# Patient Record
Sex: Female | Born: 1990 | Race: Black or African American | Hispanic: No | Marital: Single | Smoking: Current every day smoker
Health system: Southern US, Community
[De-identification: ages and names within clinical notes are randomized; demographics above are authoritative.]

## PROBLEM LIST (undated history)

## (undated) DIAGNOSIS — Z789 Other specified health status: Secondary | ICD-10-CM

## (undated) HISTORY — PX: NO PAST SURGERIES: SHX2092

---

## 2017-08-19 ENCOUNTER — Emergency Department (HOSPITAL_COMMUNITY)
Admission: EM | Admit: 2017-08-19 | Discharge: 2017-08-19 | Disposition: A | Discharge status: Home / Self Care | Payer: Self-pay | Attending: Emergency Medicine | Admitting: Emergency Medicine

## 2017-08-19 ENCOUNTER — Other Ambulatory Visit: Payer: Self-pay

## 2017-08-19 ENCOUNTER — Encounter (HOSPITAL_COMMUNITY): Payer: Self-pay | Admitting: *Deleted

## 2017-08-19 DIAGNOSIS — Z202 Contact with and (suspected) exposure to infections with a predominantly sexual mode of transmission: Secondary | ICD-10-CM | POA: Insufficient documentation

## 2017-08-19 DIAGNOSIS — N898 Other specified noninflammatory disorders of vagina: Secondary | ICD-10-CM | POA: Insufficient documentation

## 2017-08-19 LAB — WET PREP, GENITAL
Clue Cells Wet Prep HPF POC: NONE SEEN
Sperm: NONE SEEN
Trich, Wet Prep: NONE SEEN
Yeast Wet Prep HPF POC: NONE SEEN

## 2017-08-19 NOTE — Discharge Instructions (Signed)
Please read attached information. If you experience any new or worsening signs or symptoms please return to the emergency room for evaluation. Please follow-up with your primary care provider or specialist as discussed.  °

## 2017-08-19 NOTE — ED Triage Notes (Signed)
Pt in stating her partner tested positive for an STD, she denies symptoms but would like to be tested

## 2017-08-19 NOTE — ED Provider Notes (Signed)
[facility] EMERGENCY DEPARTMENT Provider Note   CSN: [id number] Arrival date & time: 08/19/17  1305   History   Chief Complaint Chief Complaint  Patient presents with  . Exposure to STD    HPI Terrin Manard is a 27 y.o. female.  HPI   27 year old female presents today with complaints of exposure to STD.  Patient notes that her significant other was tested and treated for STDs but is unsure if he had any.  She denies any vaginal bleeding discharge, denies any pain.  Patient reports she only has sex with men.   History reviewed. No pertinent past medical history.  There are no active problems to display for this patient.   History reviewed. No pertinent surgical history.  OB History    No data available       Home Medications    Prior to Admission medications   Not on File    Family History History reviewed. No pertinent family history.  Social History Social History   Tobacco Use  . Smoking status: Current Every Day Smoker  . Smokeless tobacco: Never Used  Substance Use Topics  . Alcohol use: Not on file  . Drug use: Not on file     Allergies   Patient has no known allergies.   Review of Systems Review of Systems  All other systems reviewed and are negative.   Physical Exam Updated Vital Signs LMP  (LMP Unknown)   Physical Exam  Constitutional: She is oriented to person, place, and time. She appears well-developed and well-nourished.  HENT:  Head: Normocephalic and atraumatic.  Eyes: Conjunctivae are normal. Pupils are equal, round, and reactive to light. Right eye exhibits no discharge. Left eye exhibits no discharge. No scleral icterus.  Neck: Normal range of motion. No JVD present. No tracheal deviation present.  Pulmonary/Chest: Effort normal. No stridor.  Abdominal: Soft. She exhibits no distension and no mass. There is no tenderness. There is no rebound and no guarding. No hernia.  Genitourinary:  Genitourinary  Comments: Sticky white vaginal discharge and vaginal vault no cervical motion tenderness-no bleeding  Neurological: She is alert and oriented to person, place, and time. Coordination normal.  Psychiatric: She has a normal mood and affect. Her behavior is normal. Judgment and thought content normal.  Nursing note and vitals reviewed.    ED Treatments / Results  Labs (all labs ordered are listed, but only abnormal results are displayed) Labs Reviewed  WET PREP, GENITAL  GC/CHLAMYDIA PROBE AMP ([place]) NOT AT [facility]    EKG  EKG Interpretation None       Radiology No results found.  Procedures Procedures (including critical care time)  Medications Ordered in ED Medications - No data to display   Initial Impression / Assessment and Plan / ED Course  I have reviewed the triage vital signs and the nursing notes.  Pertinent labs & imaging results that were available during my care of the patient were reviewed by me and considered in my medical decision making (see chart for details).      Final Clinical Impressions(s) / ED Diagnoses   Final diagnoses:  STD exposure    Labs: Wet prep, GC  Imaging:  Consults:  Therapeutics:   Discharge Meds:   Assessment/Plan: 27 year old female presents today with complaints of exposure to STD.  She denies any complaints currently abdominal pain vaginal discharge.  She is requesting STD testing.  STD testing performed.  Patient agitated that results were not sent immediately,  reporting that this is an inconvenience to her.  Patient argumentative.  Informed patient she may follow-up on line with her results if she did not want to stay, patient does not want to stay for results.  Patient encouraged follow-up with health department.    ED Discharge Orders    None       Eyvonne Mechanic, Cordelia Poche 08/19/17 1529    Mabe, Latanya Maudlin, MD 08/19/17 1538

## 2017-08-19 NOTE — ED Notes (Signed)
Pelvic cart set up ready to use

## 2017-08-19 NOTE — ED Notes (Signed)
Pt refuses vital signs at discharge.

## 2017-08-19 NOTE — ED Notes (Signed)
Pt choosing to leave prior to test results. Attempted to review discharge instructions with patient but she did not want to review discharge instructions with this RN prior to discharge. Pt did take paperwork and said that she would review her results online.

## 2017-08-22 LAB — GC/CHLAMYDIA PROBE AMP (~~LOC~~) NOT AT ARMC
Chlamydia: NEGATIVE
Neisseria Gonorrhea: NEGATIVE

## 2020-03-11 DIAGNOSIS — Z113 Encounter for screening for infections with a predominantly sexual mode of transmission: Secondary | ICD-10-CM | POA: Diagnosis not present

## 2020-12-28 ENCOUNTER — Other Ambulatory Visit: Payer: Self-pay

## 2020-12-28 ENCOUNTER — Ambulatory Visit
Admission: EM | Admit: 2020-12-28 | Discharge: 2020-12-28 | Disposition: A | Discharge status: Home / Self Care | Payer: PRIVATE HEALTH INSURANCE | Attending: Physician Assistant | Admitting: Physician Assistant

## 2020-12-28 ENCOUNTER — Encounter: Payer: Self-pay | Admitting: Emergency Medicine

## 2020-12-28 DIAGNOSIS — H538 Other visual disturbances: Secondary | ICD-10-CM

## 2020-12-28 DIAGNOSIS — H5712 Ocular pain, left eye: Secondary | ICD-10-CM

## 2020-12-28 DIAGNOSIS — H539 Unspecified visual disturbance: Secondary | ICD-10-CM

## 2020-12-28 MED ORDER — ERYTHROMYCIN 5 MG/GM OP OINT: TOPICAL_OINTMENT | OPHTHALMIC | 0 refills | Status: DC

## 2020-12-28 NOTE — ED Triage Notes (Signed)
Blurred vision in left eye x 2 days, worsened this morning. 4/10 pain. No vision problems in right eye. "Looks like there's a film over my eye."

## 2020-12-28 NOTE — ED Provider Notes (Signed)
[facility]    CSN: [id number] Arrival date & time: 12/28/20  1658      History   Chief Complaint Chief Complaint  Patient presents with   Eye Problem    HPI Sydney Contreras is a 30 y.o. female.   Patient presents today with gradual worsening of blurred vision and ocular pain.  She denies any injury or foreign body in her eye.  Reports symptoms are worse the medial portion of her eye and feels as though she is looking through a knot or as though there is a film over her eye.  She denies complete visual loss.  She denies episodes of similar symptoms in the past.  Denies history of multiple sclerosis or other neurological condition.  She denies associated headache, nausea, vomiting, fever.  She describes discomfort as 4 out of 10 on a 0-10 pain scale, described as irritation, worse with looking up, no alleviating factors identified.  She has not tried any over-the-counter medications for symptom management.  She does not wear glasses or contacts.   History reviewed. No pertinent past medical history.  There are no problems to display for this patient.   History reviewed. No pertinent surgical history.  OB History   No obstetric history on file.      Home Medications    Prior to Admission medications   Medication Sig Start Date End Date Taking? Authorizing Provider  erythromycin ophthalmic ointment Place a 1/2 inch ribbon of ointment into the lower eyelid. 12/28/20  Yes Mekisha Bittel, Noberto Retort, PA-C    Family History History reviewed. No pertinent family history.  Social History Social History   Tobacco Use   Smoking status: Every Day   Smokeless tobacco: Never     Allergies   Patient has no known allergies.   Review of Systems Review of Systems  Constitutional:  Negative for activity change, appetite change, fatigue and fever.  Eyes:  Positive for photophobia, pain, discharge and visual disturbance. Negative for redness and itching.  Respiratory:   Negative for cough and shortness of breath.   Cardiovascular:  Negative for chest pain.  Gastrointestinal:  Negative for abdominal pain, diarrhea, nausea and vomiting.  Neurological:  Negative for dizziness, light-headedness and headaches.    Physical Exam Triage Vital Signs ED Triage Vitals  Enc Vitals Group     BP 12/28/20 1716 135/84     Pulse Rate 12/28/20 1716 72     Resp 12/28/20 1716 14     Temp 12/28/20 1716 98 F (36.7 C)     Temp Source 12/28/20 1716 Oral     SpO2 12/28/20 1716 96 %     Weight --      Height --      Head Circumference --      Peak Flow --      Pain Score 12/28/20 1717 3     Pain Loc --      Pain Edu? --      Excl. in GC? --    No data found.  Updated Vital Signs BP 135/84 (BP Location: Left Arm)   Pulse 72   Temp 98 F (36.7 C) (Oral)   Resp 14   SpO2 96%   Visual Acuity Right Eye Distance: 20/20 Left Eye Distance: 20/70 Bilateral Distance: 20/20  Right Eye Near:   Left Eye Near:    Bilateral Near:     Physical Exam Vitals reviewed.  Constitutional:      General: She is awake. She is  not in acute distress.    Appearance: Normal appearance. She is normal weight. She is not ill-appearing.     Comments: Very pleasant female appears stated age in no acute distress sitting comfortably in exam room  HENT:     Head: Normocephalic and atraumatic.     Right Ear: Tympanic membrane, ear canal and external ear normal. Tympanic membrane is not erythematous or bulging.     Left Ear: Tympanic membrane, ear canal and external ear normal. Tympanic membrane is not erythematous or bulging.     Nose:     Right Sinus: No maxillary sinus tenderness or frontal sinus tenderness.     Left Sinus: No maxillary sinus tenderness or frontal sinus tenderness.     Mouth/Throat:     Pharynx: Uvula midline. No oropharyngeal exudate or posterior oropharyngeal erythema.  Eyes:     Extraocular Movements: Extraocular movements intact.     Conjunctiva/sclera:  Conjunctivae normal.     Pupils:     Right eye: No corneal abrasion or fluorescein uptake.     Left eye: No corneal abrasion or fluorescein uptake.     Funduscopic exam:    Right eye: No hemorrhage or papilledema.        Left eye: No hemorrhage or papilledema.     Comments: Eye: No obvious papilledema or hemorrhage noted on funduscopic exam, however, exam is limited by pupillary constriction.  No pain with extraocular movements.  No significant injection or chemosis.  No obvious abrasion or fluorescein uptake on fluorescein exam.  Mild scleral edema noted left upper portion of eye.  Cardiovascular:     Rate and Rhythm: Normal rate and regular rhythm.     Heart sounds: Normal heart sounds, S1 normal and S2 normal. No murmur heard. Pulmonary:     Effort: Pulmonary effort is normal.     Breath sounds: Normal breath sounds. No wheezing, rhonchi or rales.     Comments: Clear to auscultation bilaterally Lymphadenopathy:     Head:     Right side of head: No submental, submandibular or tonsillar adenopathy.     Left side of head: No submental, submandibular or tonsillar adenopathy.     Cervical: No cervical adenopathy.  Psychiatric:        Behavior: Behavior is cooperative.     UC Treatments / Results  Labs (all labs ordered are listed, but only abnormal results are displayed) Labs Reviewed - No data to display  EKG   Radiology No results found.  Procedures Procedures (including critical care time)  Medications Ordered in UC Medications - No data to display  Initial Impression / Assessment and Plan / UC Course  I have reviewed the triage vital signs and the nursing notes.  Pertinent labs & imaging results that were available during my care of the patient were reviewed by me and considered in my medical decision making (see chart for details).      No obvious etiology of symptoms.  No evidence of ocular emergency given clinical presentation.  Patient denies any complete  visual loss and just reports blurred vision.  We will help improve symptoms with erythromycin ointment but she was instructed to follow-up with ophthalmologist first thing tomorrow.  Discussed that if she has any worsening symptoms including ocular pain or sudden visual changes she needs to go to the emergency room overnight.  Discussed alarm symptoms that warrant emergent evaluation to which patient expressed understanding.  Strict return precautions given.  Final Clinical Impressions(s) / UC Diagnoses  Final diagnoses:  Vision changes  Blurred vision  Pain of left eye     Discharge Instructions      Use erythromycin ointment to cover for any irritation or abrasion.  Use lubricating eyedrops for additional symptom relief.  If you have any worsening symptoms please go to the emergency room including visual loss, increased ocular pain, headache, dizziness, nausea, vomiting.  Please call ophthalmologist first thing the morning to schedule an appointment if symptoms or not improving.     ED Prescriptions     Medication Sig Dispense Auth. Provider   erythromycin ophthalmic ointment Place a 1/2 inch ribbon of ointment into the lower eyelid. 3.5 g Alvie Fowles K, PA-C      PDMP not reviewed this encounter.   Jeani Hawking, PA-C 12/28/20 1804

## 2020-12-28 NOTE — Discharge Instructions (Addendum)
Use erythromycin ointment to cover for any irritation or abrasion.  Use lubricating eyedrops for additional symptom relief.  If you have any worsening symptoms please go to the emergency room including visual loss, increased ocular pain, headache, dizziness, nausea, vomiting.  Please call ophthalmologist first thing the morning to schedule an appointment if symptoms or not improving.

## 2020-12-29 ENCOUNTER — Emergency Department (HOSPITAL_COMMUNITY): Payer: PRIVATE HEALTH INSURANCE

## 2020-12-29 ENCOUNTER — Encounter (HOSPITAL_COMMUNITY): Payer: Self-pay

## 2020-12-29 ENCOUNTER — Other Ambulatory Visit: Payer: Self-pay

## 2020-12-29 ENCOUNTER — Emergency Department (HOSPITAL_COMMUNITY)
Admission: EM | Admit: 2020-12-29 | Discharge: 2020-12-30 | Disposition: A | Discharge status: Home / Self Care | Payer: PRIVATE HEALTH INSURANCE | Attending: Student | Admitting: Student

## 2020-12-29 DIAGNOSIS — Z5321 Procedure and treatment not carried out due to patient leaving prior to being seen by health care provider: Secondary | ICD-10-CM | POA: Insufficient documentation

## 2020-12-29 DIAGNOSIS — H547 Unspecified visual loss: Secondary | ICD-10-CM | POA: Insufficient documentation

## 2020-12-29 LAB — BASIC METABOLIC PANEL
Anion gap: 7 (ref 5–15)
BUN: 16 mg/dL (ref 6–20)
CO2: 25 mmol/L (ref 22–32)
Calcium: 9.2 mg/dL (ref 8.9–10.3)
Chloride: 105 mmol/L (ref 98–111)
Creatinine, Ser: 0.96 mg/dL (ref 0.44–1.00)
GFR, Estimated: >60 mL/min (ref >60)
Glucose, Bld: 115 mg/dL — ABNORMAL HIGH (ref 70–99)
Potassium: 3.7 mmol/L (ref 3.5–5.1)
Sodium: 137 mmol/L (ref 135–145)

## 2020-12-29 LAB — HCG, QUANTITATIVE, PREGNANCY: hCG, Beta Chain, Quant, S: 1 m[IU]/mL (ref <5)

## 2020-12-29 IMAGING — MR MR ORBITS WO/W CM
17 of 21 series · 35 of 48 positions shown · IV contrast (gadavist)
Comparison: None.

CLINICAL DATA: Left eye vision loss

EXAM:
MRI HEAD AND ORBITS WITHOUT AND WITH CONTRAST
TECHNIQUE: Multiplanar, multiecho pulse sequences of the brain and surrounding
structures were obtained without and with intravenous contrast.
Multiplanar, multiecho pulse sequences of the orbits and surrounding
structures were obtained including fat saturation techniques, before
and after intravenous contrast administration.
CONTRAST:  10mL GADAVIST GADOBUTROL 1 MMOL/ML IV SOLN

[Series 9: DWI · axial · 3.0mm · 0.88mm/px · z∈[-147,-18]mm · 5 of 92 slices shown (1 of 4)]
[im 1/92]
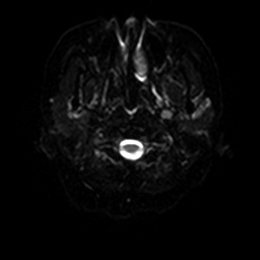
[im 23/92]
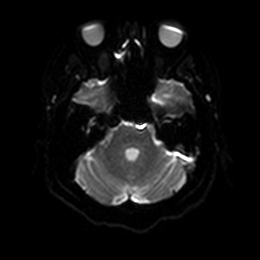
[im 46/92]
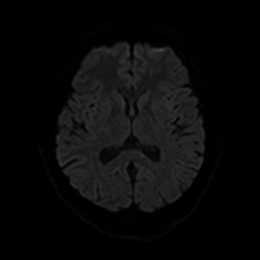
[im 69/92]
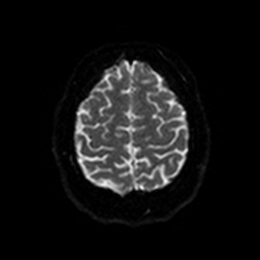
[im 92/92]
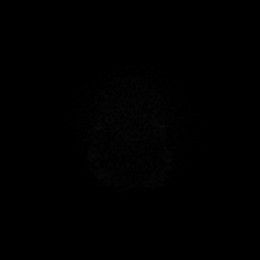

[Series 10: DWI · axial · 3.0mm · 0.88mm/px · z∈[-147,-18]mm · 2 of 46 slices shown (2 of 4)]
[im 1/46]
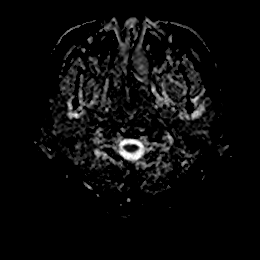
[im 46/46]
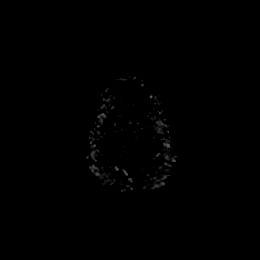

[Series 11: DWI · coronal · 4.0mm · 0.88mm/px · 5 of 70 slices shown (3 of 4)]
[im 1/70]
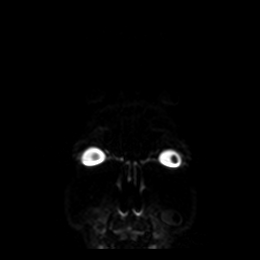
[im 18/70]
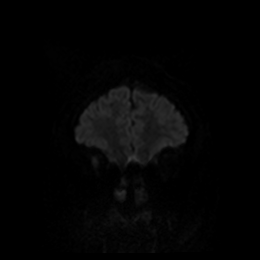
[im 35/70]
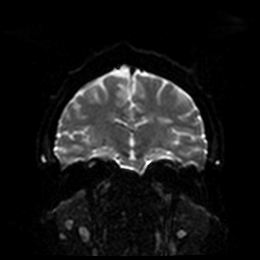
[im 52/70]
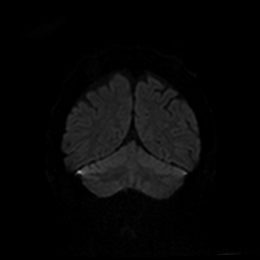
[im 70/70]
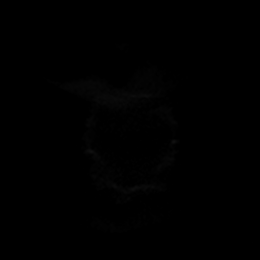

[Series 12: DWI · coronal · 4.0mm · 0.88mm/px · 2 of 35 slices shown (4 of 4)]
[im 1/35]
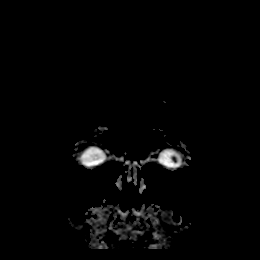
[im 35/35]
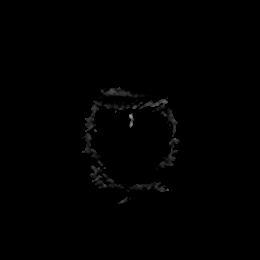

[Series 13: T1 · sagittal · 5.0mm · 0.75mm/px · 2 of 25 slices shown (1 of 3)]
[im 1/25]
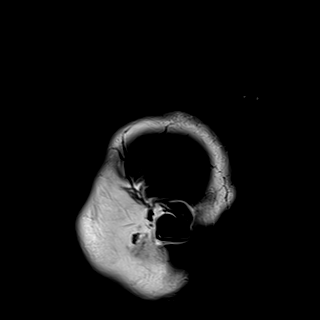
[im 25/25]
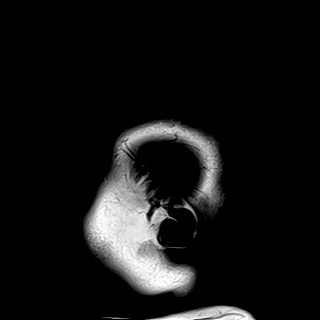

[Series 14: T2 · axial · 5.0mm · 0.72mm/px · z∈[-145,-20]mm · 2 of 23 slices shown]
[im 1/23]
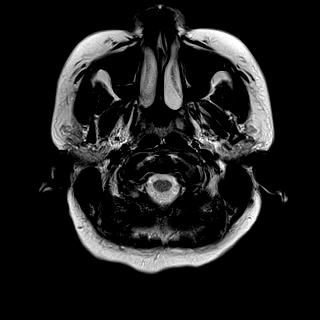
[im 23/23]
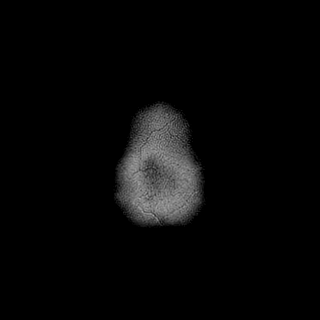

[Series 15: FLAIR · axial · 5.0mm · 0.45mm/px · z∈[-145,-20]mm · 2 of 23 slices shown]
[im 1/23]
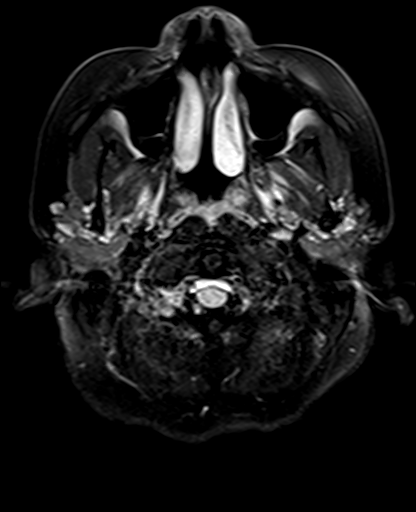
[im 23/23]
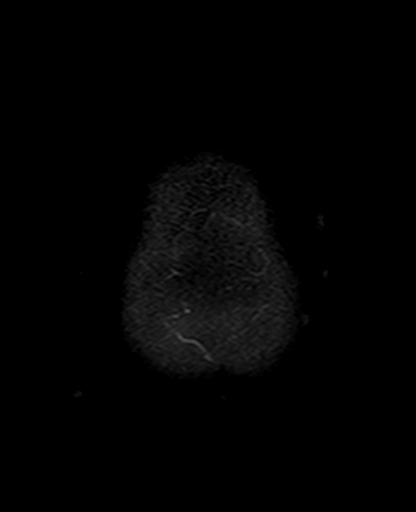

[Series 20: T1 · axial · non-contrast · 3.0mm · 0.37mm/px · 1 of 17 slices shown (2 of 3)]
[im 1/17]
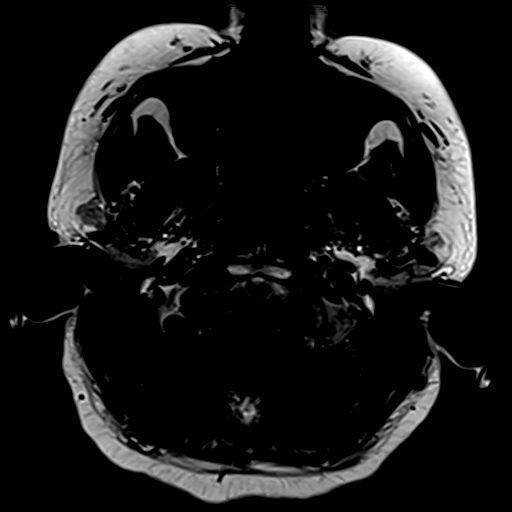

[Series 21: T2 fat-sat · axial · 3.0mm · 0.54mm/px · 1 of 17 slices shown (1 of 4)]
[im 1/17]
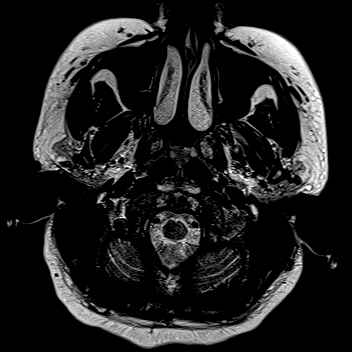

[Series 23: T2 fat-sat · axial · 3.0mm · 0.54mm/px · 1 of 17 slices shown (2 of 4)]
[im 1/17]
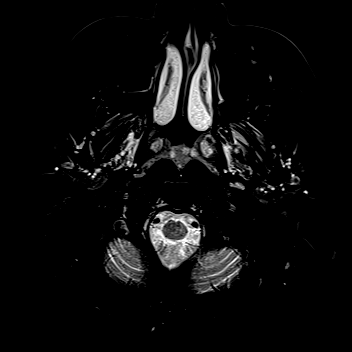

[Series 24: T2 fat-sat · coronal · 3.0mm · 0.54mm/px · 2 of 25 slices shown (3 of 4)]
[im 1/25]
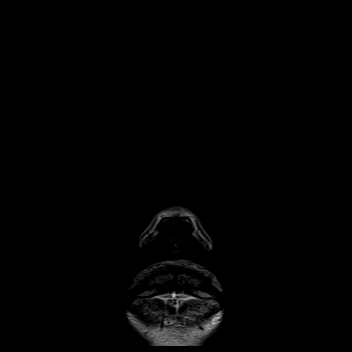
[im 25/25]
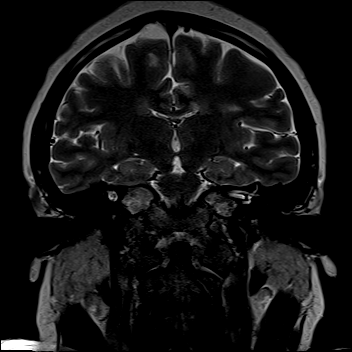

[Series 26: T2 fat-sat · coronal · 3.0mm · 0.54mm/px · 2 of 25 slices shown (4 of 4)]
[im 1/25]
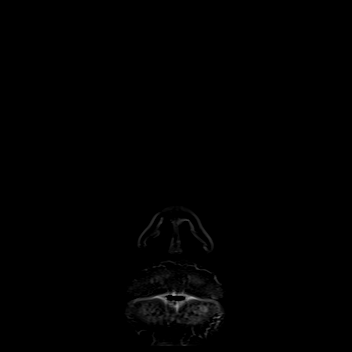
[im 25/25]
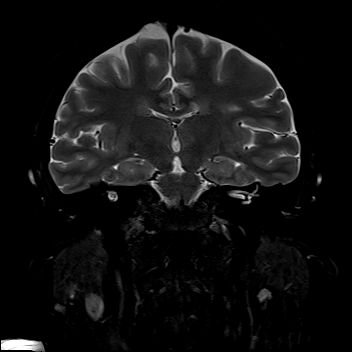

[Series 27: T1 · coronal · 3.0mm · 0.37mm/px · 2 of 25 slices shown (3 of 3)]
[im 1/25]
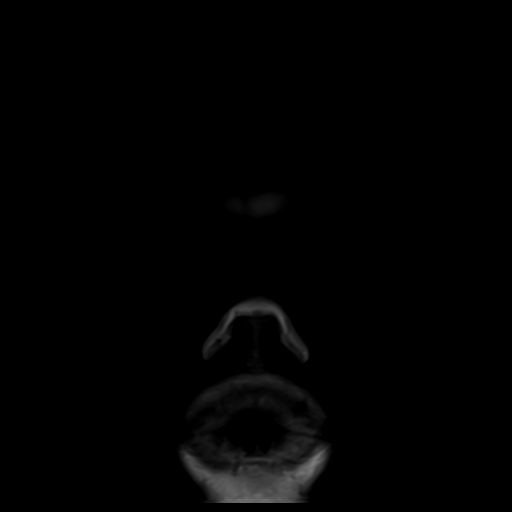
[im 25/25]
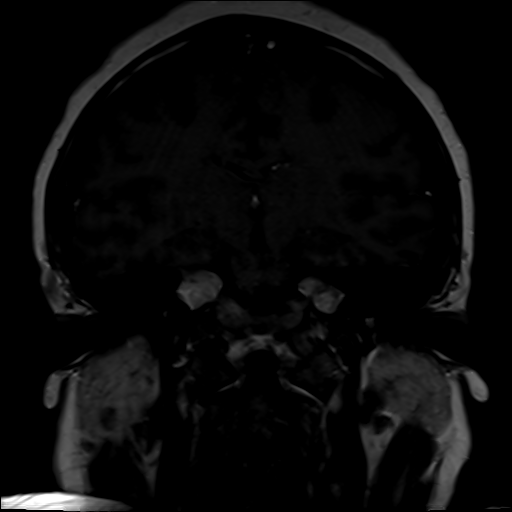

[Series 29: T2 post-contrast · coronal · 5.0mm · 0.72mm/px · 2 of 29 slices shown]
[im 1/29]
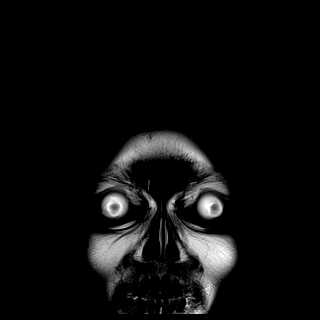
[im 29/29]
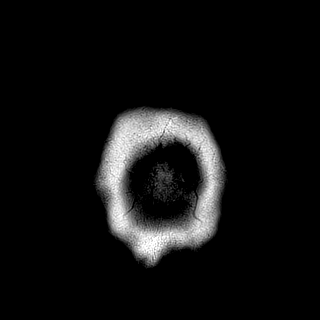

[Series 30: T1 fat-sat post-contrast · axial · 3.0mm · 0.37mm/px · 1 of 17 slices shown (1 of 2)]
[im 1/17]
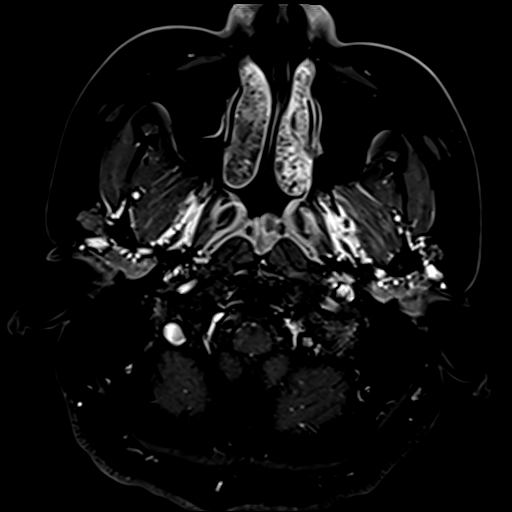

[Series 31: T1 fat-sat post-contrast · coronal · 3.0mm · 0.37mm/px · 1 of 25 slices shown (2 of 2)]
[im 1/25]
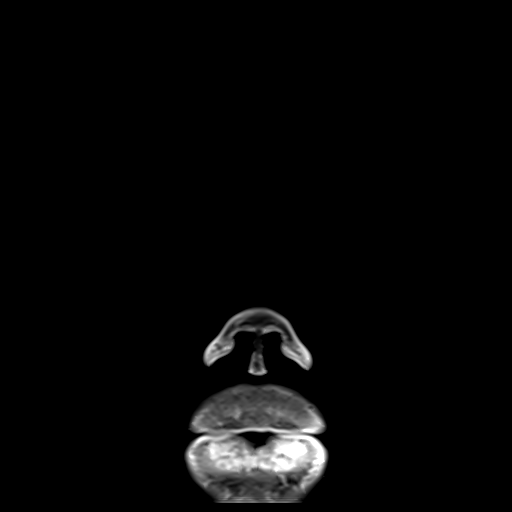

[Series 33: T1 post-contrast · coronal · 5.0mm · 0.34mm/px · 2 of 29 slices shown]
[im 1/29]
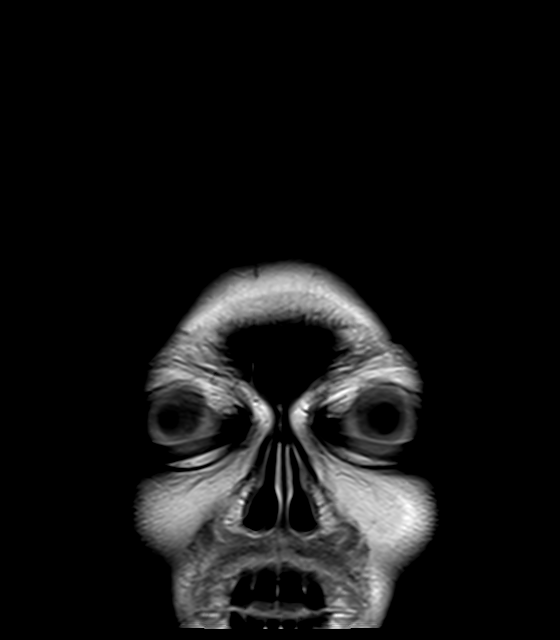
[im 29/29]
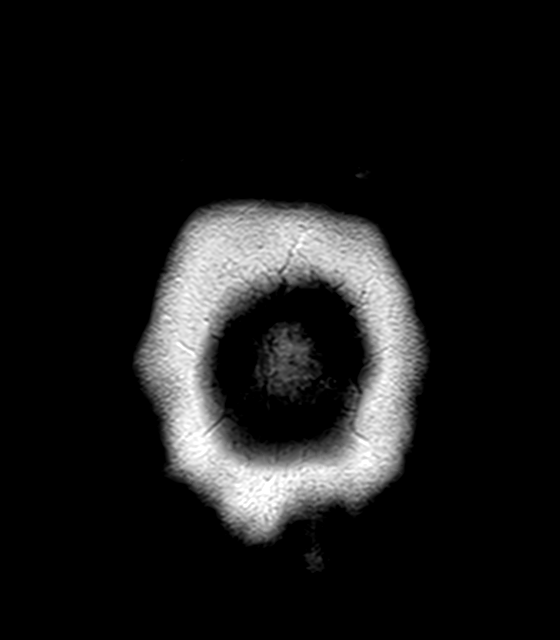

[35 of 48 positions shown; findings below may reference images not displayed]

FINDINGS: MRI HEAD FINDINGS

Brain: No acute infarct, mass effect or extra-axial collection. No
acute or chronic hemorrhage. There are multiple pericallosal and
periventricular white matter lesions within both hemispheres,
left-greater-than-right. There are approximately 10-20 lesions.
There is mild diffusion restriction associated with multiple lesions
there are contrast enhancing lesions in the superior left parietal
lobe, anterior left frontal lobe and at the left parietal temporal
junction. No enhancing lesions in the right hemisphere. The midline
structures are normal.

Vascular: Major flow voids are preserved.

Skull and upper cervical spine: Normal calvarium and skull base.
Visualized upper cervical spine and soft tissues are normal.

MRI ORBITS FINDINGS

Orbits:

--Globes: Normal.

--Bony orbit: Normal.

--Preseptal soft tissues: Normal.

--Intra- and extraconal orbital fat: Normal. No inflammatory
stranding.

--Optic nerves: There is abnormal contrast enhancement of the left
optic nerve within the left orbit. The right optic nerve is normal.

--Lacrimal glands and fossae: Normal.

--Extraocular muscles: Normal.

Visualized sinuses:  No fluid levels or advanced mucosal thickening.

Soft tissues: Normal.
IMPRESSION: 1. Acute on chronic demyelinating disease, most commonly indicating
multiple sclerosis. There are at least 4 active demyelinating
lesions.
2. Abnormal contrast enhancement of the left optic nerve consistent
with optic neuritis.

## 2020-12-29 IMAGING — MR MR HEAD WO/W CM
17 of 21 series · 35 of 48 positions shown · IV contrast (gadavist)
Comparison: None.

CLINICAL DATA: Left eye vision loss

EXAM:
MRI HEAD AND ORBITS WITHOUT AND WITH CONTRAST
TECHNIQUE: Multiplanar, multiecho pulse sequences of the brain and surrounding
structures were obtained without and with intravenous contrast.
Multiplanar, multiecho pulse sequences of the orbits and surrounding
structures were obtained including fat saturation techniques, before
and after intravenous contrast administration.
CONTRAST:  10mL GADAVIST GADOBUTROL 1 MMOL/ML IV SOLN

[Series 9: DWI · axial · 3.0mm · 0.88mm/px · z∈[-147,-18]mm · 5 of 92 slices shown (1 of 4)]
[im 1/92]
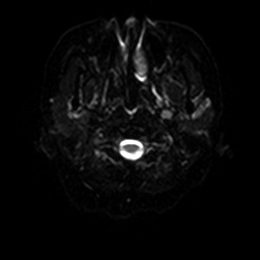
[im 23/92]
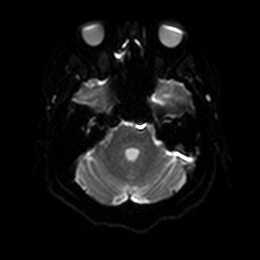
[im 46/92]
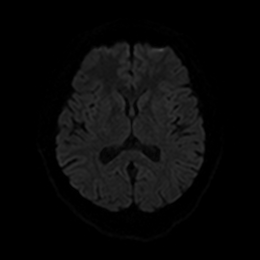
[im 69/92]
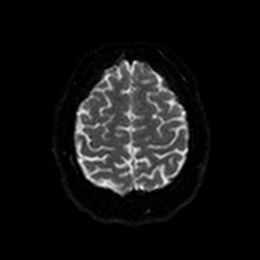
[im 92/92]
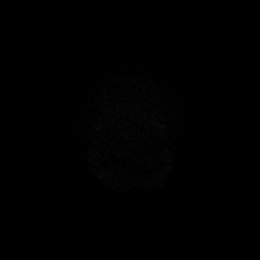

[Series 10: DWI · axial · 3.0mm · 0.88mm/px · z∈[-147,-18]mm · 2 of 46 slices shown (2 of 4)]
[im 1/46]
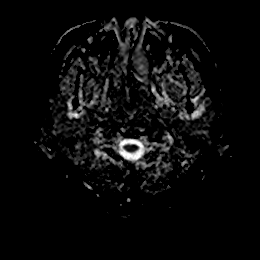
[im 46/46]
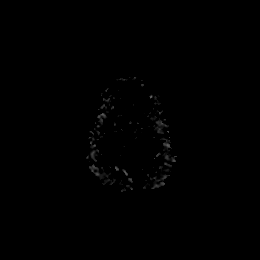

[Series 11: DWI · coronal · 4.0mm · 0.88mm/px · 5 of 70 slices shown (3 of 4)]
[im 1/70]
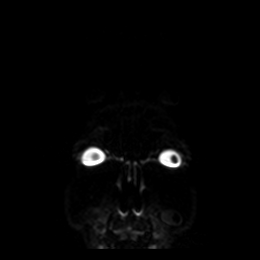
[im 18/70]
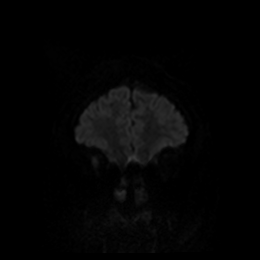
[im 35/70]
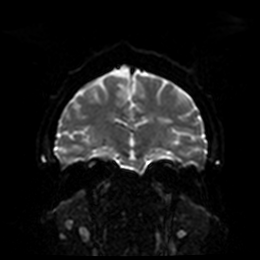
[im 52/70]
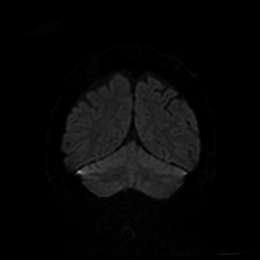
[im 70/70]
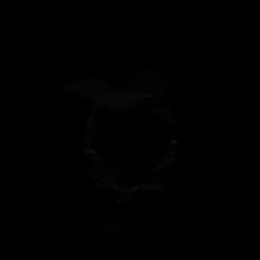

[Series 12: DWI · coronal · 4.0mm · 0.88mm/px · 2 of 35 slices shown (4 of 4)]
[im 1/35]
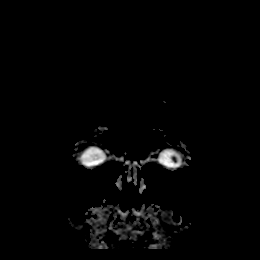
[im 35/35]
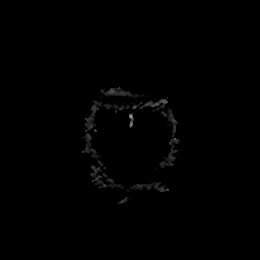

[Series 13: T1 · sagittal · 5.0mm · 0.75mm/px · 2 of 25 slices shown (1 of 3)]
[im 1/25]
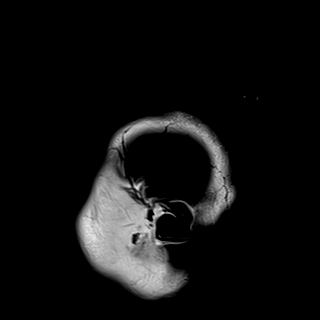
[im 25/25]
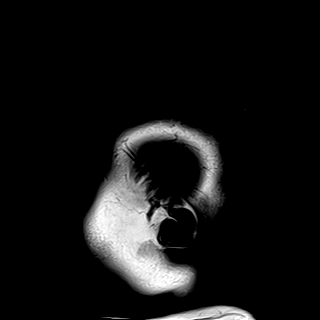

[Series 14: T2 · axial · 5.0mm · 0.72mm/px · z∈[-145,-20]mm · 2 of 23 slices shown]
[im 1/23]
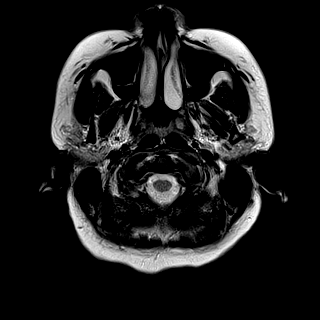
[im 23/23]
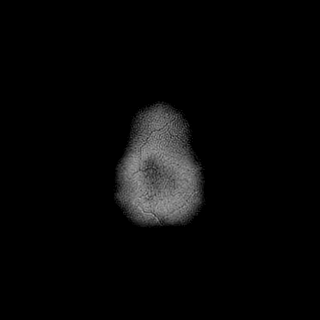

[Series 15: FLAIR · axial · 5.0mm · 0.45mm/px · z∈[-145,-20]mm · 2 of 23 slices shown]
[im 1/23]
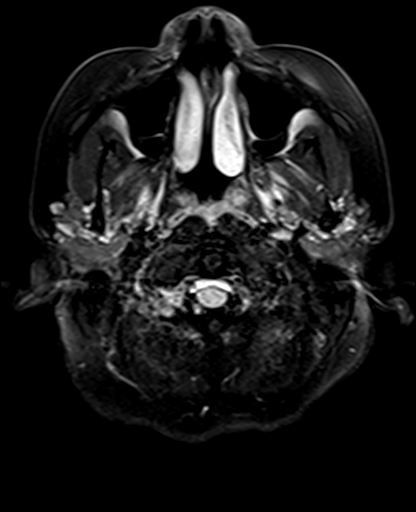
[im 23/23]
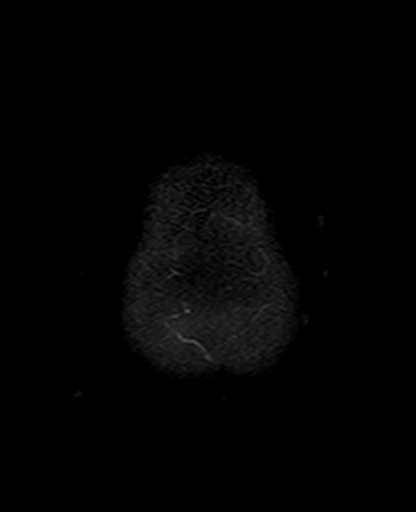

[Series 20: T1 · axial · non-contrast · 3.0mm · 0.37mm/px · 1 of 17 slices shown (2 of 3)]
[im 1/17]
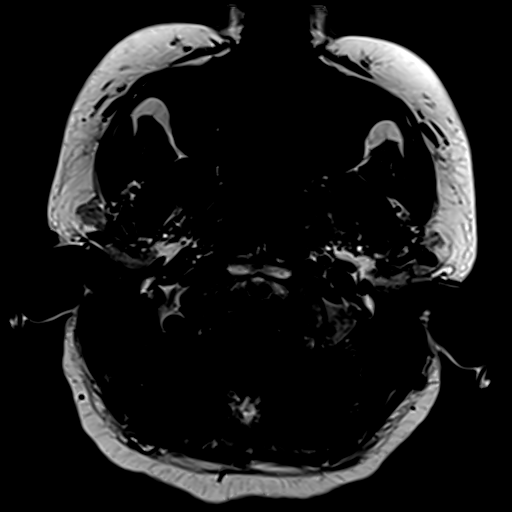

[Series 21: T2 fat-sat · axial · 3.0mm · 0.54mm/px · 1 of 17 slices shown (1 of 4)]
[im 1/17]
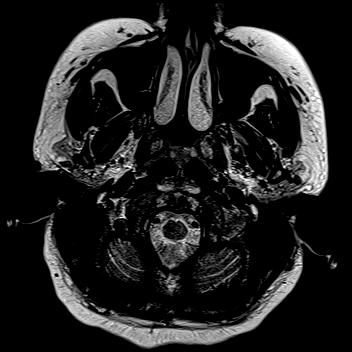

[Series 23: T2 fat-sat · axial · 3.0mm · 0.54mm/px · 1 of 17 slices shown (2 of 4)]
[im 1/17]
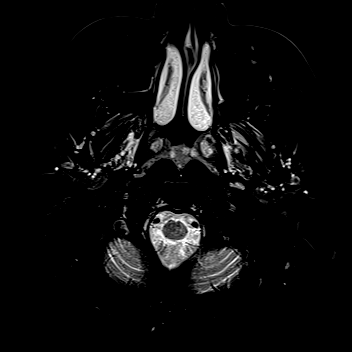

[Series 24: T2 fat-sat · coronal · 3.0mm · 0.54mm/px · 2 of 25 slices shown (3 of 4)]
[im 1/25]
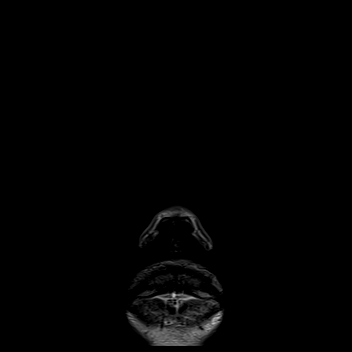
[im 25/25]
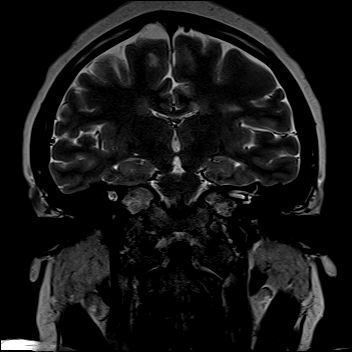

[Series 26: T2 fat-sat · coronal · 3.0mm · 0.54mm/px · 2 of 25 slices shown (4 of 4)]
[im 1/25]
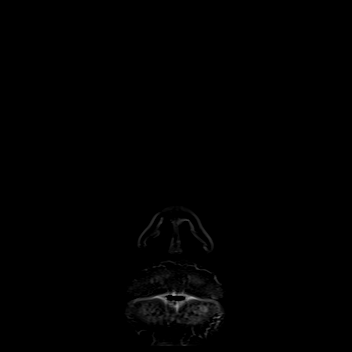
[im 25/25]
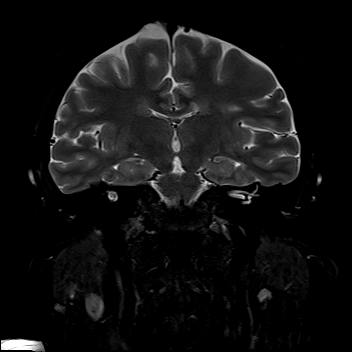

[Series 27: T1 · coronal · 3.0mm · 0.37mm/px · 2 of 25 slices shown (3 of 3)]
[im 1/25]
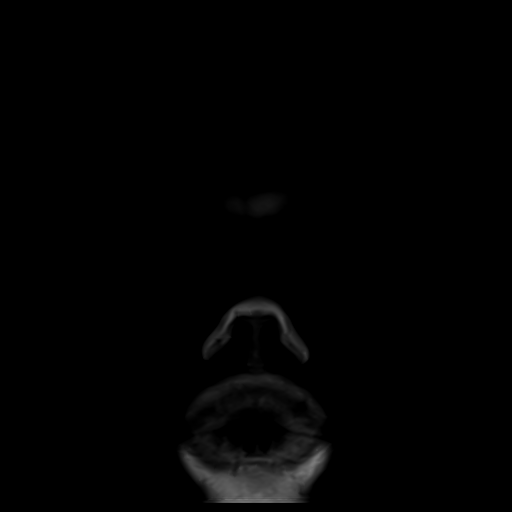
[im 25/25]
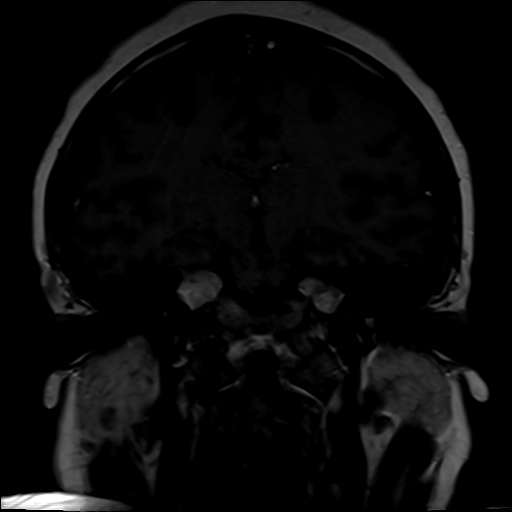

[Series 29: T2 post-contrast · coronal · 5.0mm · 0.72mm/px · 2 of 29 slices shown]
[im 1/29]
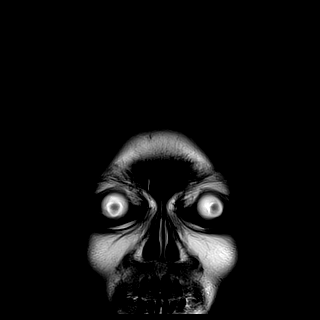
[im 29/29]
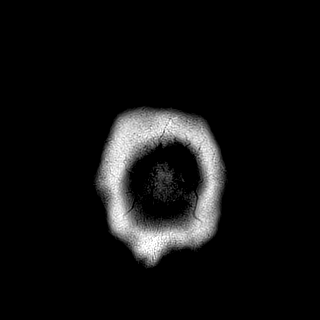

[Series 30: T1 fat-sat post-contrast · axial · 3.0mm · 0.37mm/px · 1 of 17 slices shown (1 of 2)]
[im 1/17]
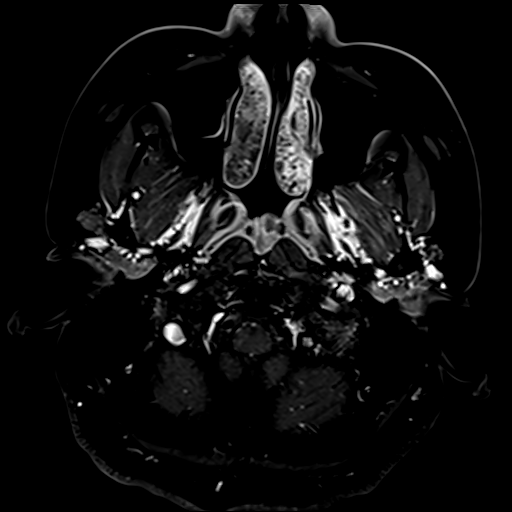

[Series 31: T1 fat-sat post-contrast · coronal · 3.0mm · 0.37mm/px · 1 of 25 slices shown (2 of 2)]
[im 1/25]
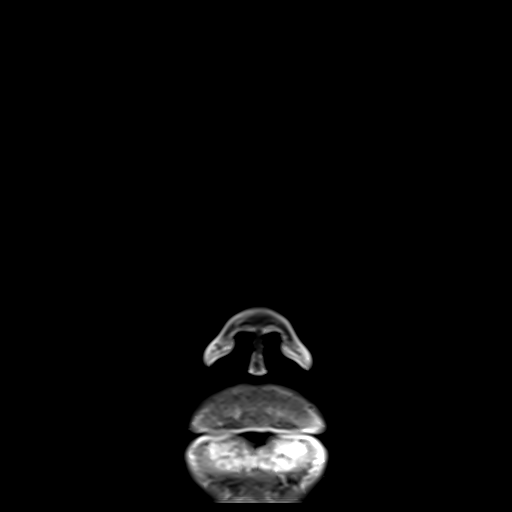

[Series 33: T1 post-contrast · coronal · 5.0mm · 0.34mm/px · 2 of 29 slices shown]
[im 1/29]
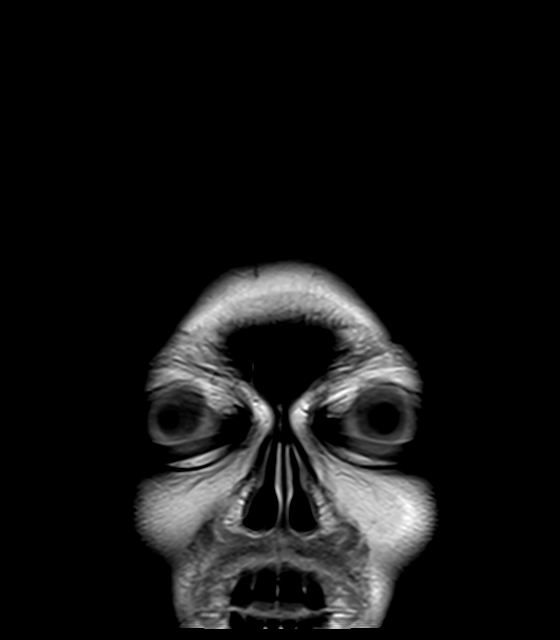
[im 29/29]
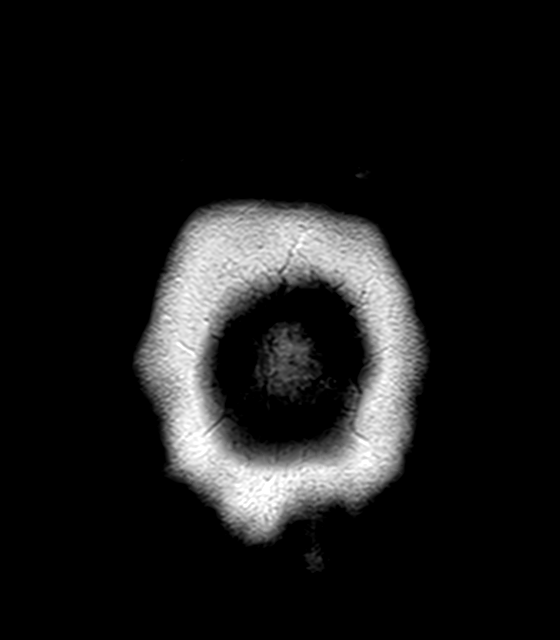

[35 of 48 positions shown; findings below may reference images not displayed]

FINDINGS: MRI HEAD FINDINGS

Brain: No acute infarct, mass effect or extra-axial collection. No
acute or chronic hemorrhage. There are multiple pericallosal and
periventricular white matter lesions within both hemispheres,
left-greater-than-right. There are approximately 10-20 lesions.
There is mild diffusion restriction associated with multiple lesions
there are contrast enhancing lesions in the superior left parietal
lobe, anterior left frontal lobe and at the left parietal temporal
junction. No enhancing lesions in the right hemisphere. The midline
structures are normal.

Vascular: Major flow voids are preserved.

Skull and upper cervical spine: Normal calvarium and skull base.
Visualized upper cervical spine and soft tissues are normal.

MRI ORBITS FINDINGS

Orbits:

--Globes: Normal.

--Bony orbit: Normal.

--Preseptal soft tissues: Normal.

--Intra- and extraconal orbital fat: Normal. No inflammatory
stranding.

--Optic nerves: There is abnormal contrast enhancement of the left
optic nerve within the left orbit. The right optic nerve is normal.

--Lacrimal glands and fossae: Normal.

--Extraocular muscles: Normal.

Visualized sinuses:  No fluid levels or advanced mucosal thickening.

Soft tissues: Normal.
IMPRESSION: 1. Acute on chronic demyelinating disease, most commonly indicating
multiple sclerosis. There are at least 4 active demyelinating
lesions.
2. Abnormal contrast enhancement of the left optic nerve consistent
with optic neuritis.

## 2020-12-29 MED ORDER — GADOBUTROL 1 MMOL/ML IV SOLN
10.0000 mL | Freq: Once | INTRAVENOUS | Status: AC | PRN
  Administered 2020-12-29: 10 mL via INTRAVENOUS

## 2020-12-29 NOTE — ED Triage Notes (Signed)
Pt arrives POV for eval of L sided vision loss out of L eye. Reports gradual onset  and Thursday "completely lost vision". Seen by UC who referred her to optho who then referred her here for MRI d/t a dx of optic neuritis.

## 2020-12-29 NOTE — ED Provider Notes (Signed)
Emergency Medicine Provider Triage Evaluation Note  Sydney Contreras , a 30 y.o. female  was evaluated in triage.  Pt complains of left vision loss.  I suspect her worsening Wednesday, Thursday she lost complete vision in the left eye and was seen in urgent today she went to the ophthalmologist who sent her here for MRI.Marland Kitchen  Review of Systems  Positive: VISION LOSS Negative: HEADACHE  Physical Exam  BP (!) 142/100   Pulse 96   Temp 98.6 F (37 C)   Resp 16   Ht 5\' 7"  (1.702 m)   Wt 117.9 kg   SpO2 98%   BMI 40.72 kg/m  Gen:   Awake, no distress   Resp:  Normal effort  MSK:   Moves extremities without difficulty  Other:    Medical Decision Making  Medically screening exam initiated at 5:22 PM.  Appropriate orders placed.  Jenilee Ormiston was informed that the remainder of the evaluation will be completed by another provider, this initial triage assessment does not replace that evaluation, and the importance of remaining in the ED until their evaluation is complete.     Jomarie Longs, PA-C 12/29/20 1722    12/31/20, MD 01/01/21 [phone]

## 2020-12-30 ENCOUNTER — Encounter (HOSPITAL_COMMUNITY): Payer: Self-pay | Admitting: Internal Medicine

## 2020-12-30 ENCOUNTER — Inpatient Hospital Stay (HOSPITAL_COMMUNITY)
Admission: EM | Admit: 2020-12-30 | Discharge: 2021-01-03 | DRG: 123 | Disposition: A | Discharge status: Home / Self Care | Payer: PRIVATE HEALTH INSURANCE | Attending: Internal Medicine | Admitting: Internal Medicine

## 2020-12-30 ENCOUNTER — Other Ambulatory Visit: Payer: Self-pay

## 2020-12-30 DIAGNOSIS — Z20822 Contact with and (suspected) exposure to covid-19: Secondary | ICD-10-CM | POA: Diagnosis present

## 2020-12-30 DIAGNOSIS — H469 Unspecified optic neuritis: Secondary | ICD-10-CM | POA: Diagnosis not present

## 2020-12-30 DIAGNOSIS — Z79899 Other long term (current) drug therapy: Secondary | ICD-10-CM

## 2020-12-30 DIAGNOSIS — Z6838 Body mass index (BMI) 38.0-38.9, adult: Secondary | ICD-10-CM

## 2020-12-30 DIAGNOSIS — Z833 Family history of diabetes mellitus: Secondary | ICD-10-CM

## 2020-12-30 DIAGNOSIS — T380X5A Adverse effect of glucocorticoids and synthetic analogues, initial encounter: Secondary | ICD-10-CM | POA: Diagnosis not present

## 2020-12-30 DIAGNOSIS — G35 Multiple sclerosis: Secondary | ICD-10-CM | POA: Diagnosis present

## 2020-12-30 DIAGNOSIS — R739 Hyperglycemia, unspecified: Secondary | ICD-10-CM | POA: Diagnosis not present

## 2020-12-30 DIAGNOSIS — F17200 Nicotine dependence, unspecified, uncomplicated: Secondary | ICD-10-CM | POA: Diagnosis present

## 2020-12-30 DIAGNOSIS — E669 Obesity, unspecified: Secondary | ICD-10-CM | POA: Diagnosis present

## 2020-12-30 HISTORY — DX: Other specified health status: Z78.9

## 2020-12-30 LAB — CBC WITH DIFFERENTIAL/PLATELET
Abs Immature Granulocytes: 0.08 10*3/uL — ABNORMAL HIGH (ref 0.00–0.07)
Basophils Absolute: 0.1 10*3/uL (ref 0.0–0.1)
Basophils Relative: 1 %
Eosinophils Absolute: 0.5 10*3/uL (ref 0.0–0.5)
Eosinophils Relative: 3 %
HCT: 41.8 % (ref 36.0–46.0)
Hemoglobin: 13.6 g/dL (ref 12.0–15.0)
Immature Granulocytes: 1 %
Lymphocytes Relative: 24 %
Lymphs Abs: 3.3 10*3/uL (ref 0.7–4.0)
MCH: 27.9 pg (ref 26.0–34.0)
MCHC: 32.5 g/dL (ref 30.0–36.0)
MCV: 85.8 fL (ref 80.0–100.0)
Monocytes Absolute: 1.2 10*3/uL — ABNORMAL HIGH (ref 0.1–1.0)
Monocytes Relative: 9 %
Neutro Abs: 8.5 10*3/uL — ABNORMAL HIGH (ref 1.7–7.7)
Neutrophils Relative %: 62 %
Platelets: 325 10*3/uL (ref 150–400)
RBC: 4.87 MIL/uL (ref 3.87–5.11)
RDW: 15.3 % (ref 11.5–15.5)
WBC: 13.6 10*3/uL — ABNORMAL HIGH (ref 4.0–10.5)
nRBC: 0 % (ref 0.0–0.2)

## 2020-12-30 LAB — GLUCOSE, CAPILLARY: Glucose-Capillary: 115 mg/dL — ABNORMAL HIGH (ref 70–99)

## 2020-12-30 LAB — RESP PANEL BY RT-PCR (FLU A&B, COVID) ARPGX2
Influenza A by PCR: NEGATIVE
Influenza B by PCR: NEGATIVE
SARS Coronavirus 2 by RT PCR: NEGATIVE

## 2020-12-30 MED ORDER — ACETAMINOPHEN 650 MG RE SUPP: 650.0000 mg | Freq: Four times a day (QID) | RECTAL | Status: DC | PRN

## 2020-12-30 MED ORDER — CALCIUM CARBONATE 1250 (500 CA) MG PO TABS: 1.0000 | ORAL_TABLET | Freq: Every day | ORAL | Status: DC

## 2020-12-30 MED ORDER — THIAMINE HCL 100 MG PO TABS
100.0000 mg | ORAL_TABLET | Freq: Every day | ORAL | Status: DC
  Administered 2020-12-31: 100 mg via ORAL
  Filled 2020-12-30: qty 1

## 2020-12-30 MED ORDER — SODIUM CHLORIDE 0.9 % IV SOLN
1000.0000 mg | INTRAVENOUS | Status: DC
  Administered 2020-12-30 – 2021-01-01 (×3): 1000 mg via INTRAVENOUS
  Filled 2020-12-30 (×4): qty 8

## 2020-12-30 MED ORDER — INSULIN ASPART 100 UNIT/ML IJ SOLN
0.0000 [IU] | Freq: Three times a day (TID) | INTRAMUSCULAR | Status: DC
  Administered 2021-01-01: 2 [IU] via SUBCUTANEOUS
  Administered 2021-01-01: 3 [IU] via SUBCUTANEOUS
  Administered 2021-01-02 – 2021-01-03 (×3): 2 [IU] via SUBCUTANEOUS

## 2020-12-30 MED ORDER — ACETAMINOPHEN 325 MG PO TABS: 650.0000 mg | ORAL_TABLET | Freq: Four times a day (QID) | ORAL | Status: DC | PRN

## 2020-12-30 MED ORDER — CALCIUM CITRATE-VITAMIN D 500-500 MG-UNIT PO CHEW
1.0000 | CHEWABLE_TABLET | Freq: Every day | ORAL | Status: DC
  Filled 2020-12-30: qty 1

## 2020-12-30 MED ORDER — PANTOPRAZOLE SODIUM 40 MG IV SOLR
40.0000 mg | INTRAVENOUS | Status: DC
  Administered 2020-12-30 – 2021-01-01 (×3): 40 mg via INTRAVENOUS
  Filled 2020-12-30 (×3): qty 40

## 2020-12-30 MED ORDER — ENOXAPARIN SODIUM 40 MG/0.4ML IJ SOSY
40.0000 mg | PREFILLED_SYRINGE | INTRAMUSCULAR | Status: DC
  Filled 2020-12-30: qty 0.4

## 2020-12-30 NOTE — ED Provider Notes (Signed)
Emergency Medicine Provider Triage Evaluation Note  Sydney Contreras , a 30 y.o. female  was evaluated in triage.  Pt complains of vision changes.  Patient states she had a work-up done outpatient, was in the ER yesterday but left prior to finishing her work-up.  She was called and told to come back in for treatment.  Per chart review, patient was diagnosed with new MS.  This is affecting her vision, and this was discussed with neurology, who recommended she be admitted for IV steroids.  Patient reports no new symptoms since yesterday  Review of Systems  Positive: Vision changes Negative: weakness  Physical Exam  BP (!) 143/82 (BP Location: Right Arm)   Pulse 89   Temp 98.4 F (36.9 C) (Oral)   Resp 17   SpO2 100%  Gen:   Awake, no distress   Resp:  Normal effort  MSK:   Moves extremities without difficulty  Other:  Strabismus noted. Strength and sensation intact x4  Medical Decision Making  Medically screening exam initiated at 1:16 PM.  Appropriate orders placed.  Sydney Contreras was informed that the remainder of the evaluation will be completed by another provider, this initial triage assessment does not replace that evaluation, and the importance of remaining in the ED until their evaluation is complete.  Patient returning to the ER to complete treatment for MS.  Informed charge nurse that patient will need a room sooner rather than later as she has potential vision compromise    Sydney Apley, PA-C 12/30/20 1319    Sydney Cockayne, MD 12/31/20 1106

## 2020-12-30 NOTE — ED Notes (Signed)
Called pt name to be brought back to room, no response.

## 2020-12-30 NOTE — ED Notes (Signed)
Pt did not want to stay and left . 

## 2020-12-30 NOTE — ED Triage Notes (Signed)
Pt reports having a MRI done yesterday which showed findings of MS so was told to come back to the ED to begin steroid tx.

## 2020-12-30 NOTE — H&P (Signed)
History and Physical    Sydney Contreras ZOX:[id number] DOB: [birth year] DOA: 12/30/2020  PCP: Pcp, No  Patient coming from: Home.  Chief Complaint: Left eye blurred vision.  HPI: Sydney Contreras is a 30 y.o. female with no significant past medical history presents to the ER with complaints of persistent left-sided blurred vision.  Patient symptoms started on December 26, 2020 about 5 days ago with left blurred vision.  Which persisted and patient went to the urgent care was given an eye ointment.  Patient had come to the ER yesterday had MRI brain and orbits done but had to leave to take care of her kid and presented back today.  Denies any numbness or weakness of the extremities.  Denies any difficulty swallowing or speaking.  No problem with the right eye.  ED Course: In the ER labs showed leukocytosis.  COVID test was negative.  MRI brain with and without contrast and MRI orbit on the left side was done with and without contrast.  Shows features concerning for acute on chronic demyelination with 4 active demanding lesions and features also concerning for left-sided optic neuritis.  On-call neurologist was consulted patient was started on IV Solu-Medrol admitted for further observation.  Review of Systems: As per HPI, rest all negative.   Past Medical History:  Diagnosis Date   Medical history non-contributory     Past Surgical History:  Procedure Laterality Date   NO PAST SURGERIES       reports that she has been smoking. She has never used smokeless tobacco. No history on file for alcohol use and drug use.  No Known Allergies  History reviewed. No pertinent family history.  Prior to Admission medications   Medication Sig Start Date End Date Taking? Authorizing Provider  etonogestrel (NEXPLANON) 68 MG IMPL implant 68 mg by Subdermal route once.   Yes [provider]  erythromycin ophthalmic ointment Place a 1/2 inch ribbon of ointment into the lower eyelid. Patient not  taking: Reported on 12/30/2020 12/28/20   Raspet, Noberto Retort, PA-C    Physical Exam: Constitutional: Moderately built and nourished. Vitals:   12/30/20 1303 12/30/20 1600 12/30/20 1931  BP: (!) 143/82 132/72 124/73  Pulse: 89 92 72  Resp: 17 17 16   Temp: 98.4 F (36.9 C) 98.8 F (37.1 C) 98.6 F (37 C)  TempSrc: Oral Oral Oral  SpO2: 100% 100% 100%   Eyes: Anicteric no pallor. ENMT: No discharge from the ears eyes nose and mouth. Neck: No mass felt.  No neck rigidity. Respiratory: No rhonchi or crepitations. Cardiovascular: S1-S2 heard. Abdomen: Soft nontender bowel sounds present. Musculoskeletal: No edema. Skin: No rash. Neurologic: Alert awake oriented to time place and person.  Has poor vision on the left side.  Moving all extremities 5 x 5. Psychiatric: Appears normal.  Normal affect.   Labs on Admission: I have personally reviewed following labs and imaging studies  CBC: Recent Labs  Lab 12/30/20 2017  WBC 13.6*  NEUTROABS 8.5*  HGB 13.6  HCT 41.8  MCV 85.8  PLT 325   Basic Metabolic Panel: Recent Labs  Lab 12/29/20 1726  NA 137  K 3.7  CL 105  CO2 25  GLUCOSE 115*  BUN 16  CREATININE 0.96  CALCIUM 9.2   GFR: Estimated Creatinine Clearance: 115.8 mL/min (by C-G formula based on SCr of 0.96 mg/dL). Liver Function Tests: No results for input(s): AST, ALT, ALKPHOS, BILITOT, PROT, ALBUMIN in the last 168 hours. No results for input(s): LIPASE, AMYLASE  in the last 168 hours. No results for input(s): AMMONIA in the last 168 hours. Coagulation Profile: No results for input(s): INR, PROTIME in the last 168 hours. Cardiac Enzymes: No results for input(s): CKTOTAL, CKMB, CKMBINDEX, TROPONINI in the last 168 hours. BNP (last 3 results) No results for input(s): PROBNP in the last 8760 hours. HbA1C: No results for input(s): HGBA1C in the last 72 hours. CBG: No results for input(s): GLUCAP in the last 168 hours. Lipid Profile: No results for input(s): CHOL,  HDL, LDLCALC, TRIG, CHOLHDL, LDLDIRECT in the last 72 hours. Thyroid Function Tests: No results for input(s): TSH, T4TOTAL, FREET4, T3FREE, THYROIDAB in the last 72 hours. Anemia Panel: No results for input(s): VITAMINB12, FOLATE, FERRITIN, TIBC, IRON, RETICCTPCT in the last 72 hours. Urine analysis: No results found for: COLORURINE, APPEARANCEUR, LABSPEC, PHURINE, GLUCOSEU, HGBUR, BILIRUBINUR, KETONESUR, PROTEINUR, UROBILINOGEN, NITRITE, LEUKOCYTESUR Sepsis Labs: @LABRCNTIP (procalcitonin:4,lacticidven:4) ) Recent Results (from the past 240 hour(s))  Resp Panel by RT-PCR (Flu A&B, Covid) Nasopharyngeal Swab     Status: None   Collection Time: 12/30/20  9:01 PM   Specimen: Nasopharyngeal Swab; Nasopharyngeal(NP) swabs in vial transport medium  Result Value Ref Range Status   SARS Coronavirus 2 by RT PCR NEGATIVE NEGATIVE Final    Comment: (NOTE) SARS-CoV-2 target nucleic acids are NOT DETECTED.  The SARS-CoV-2 RNA is generally detectable in upper respiratory specimens during the acute phase of infection. The lowest concentration of SARS-CoV-2 viral copies this assay can detect is 138 copies/mL. A negative result does not preclude SARS-Cov-2 infection and should not be used as the sole basis for treatment or other patient management decisions. A negative result may occur with  improper specimen collection/handling, submission of specimen other than nasopharyngeal swab, presence of viral mutation(s) within the areas targeted by this assay, and inadequate number of viral copies(<138 copies/mL). A negative result must be combined with clinical observations, patient history, and epidemiological information. The expected result is Negative.  Fact Sheet for Patients:  01/01/21  Fact Sheet for Healthcare Providers:  BloggerCourse.com  This test is no t yet approved or cleared by the [employer].[place] FDA and  has been authorized for  detection and/or diagnosis of SARS-CoV-2 by FDA under an Emergency Use Authorization (EUA). This EUA will remain  in effect (meaning this test can be used) for the duration of the COVID-19 declaration under Section 564(b)(1) of the Act, 21 U.S.C.section 360bbb-3(b)(1), unless the authorization is terminated  or revoked sooner.       Influenza A by PCR NEGATIVE NEGATIVE Final   Influenza B by PCR NEGATIVE NEGATIVE Final    Comment: (NOTE) The Xpert Xpress SARS-CoV-2/FLU/RSV plus assay is intended as an aid in the diagnosis of influenza from Nasopharyngeal swab specimens and should not be used as a sole basis for treatment. Nasal washings and aspirates are unacceptable for Xpert Xpress SARS-CoV-2/FLU/RSV testing.  Fact Sheet for Patients: [place]  Fact Sheet for Healthcare Providers: BloggerCourse.com  This test is not yet approved or cleared by the [employer]ious[employer].[place] FDA and has been authorized for detection and/or diagnosis of SARS-CoV-2 by FDA under an Emergency Use Authorization (EUA). This EUA will remain in effect (meaning this test can be used) for the duration of the COVID-19 declaration under Section 564(b)(1) of the Act, 21 U.S.C. section 360bbb-3(b)(1), unless the authorization is terminated or revoked.  Performed at [facility], [address], [place], [place]      Radiological Exams on Admission: MR Brain W and Wo Contrast  Result Date: 12/29/2020 CLINICAL DATA:  Left eye vision loss EXAM: MRI HEAD AND ORBITS WITHOUT AND WITH CONTRAST TECHNIQUE: Multiplanar, multiecho pulse sequences of the brain and surrounding structures were obtained without and with intravenous contrast. Multiplanar, multiecho pulse sequences of the orbits and surrounding structures were obtained including fat saturation techniques, before and after intravenous contrast administration. CONTRAST:  20mL GADAVIST GADOBUTROL 1  MMOL/ML IV SOLN COMPARISON:  None. FINDINGS: MRI HEAD FINDINGS Brain: No acute infarct, mass effect or extra-axial collection. No acute or chronic hemorrhage. There are multiple pericallosal and periventricular white matter lesions within both hemispheres, left-greater-than-right. There are approximately 10-20 lesions. There is mild diffusion restriction associated with multiple lesions there are contrast enhancing lesions in the superior left parietal lobe, anterior left frontal lobe and at the left parietal temporal junction. No enhancing lesions in the right hemisphere. The midline structures are normal. Vascular: Major flow voids are preserved. Skull and upper cervical spine: Normal calvarium and skull base. Visualized upper cervical spine and soft tissues are normal. MRI ORBITS FINDINGS Orbits: --Globes: Normal. --Bony orbit: Normal. --Preseptal soft tissues: Normal. --Intra- and extraconal orbital fat: Normal. No inflammatory stranding. --Optic nerves: There is abnormal contrast enhancement of the left optic nerve within the left orbit. The right optic nerve is normal. --Lacrimal glands and fossae: Normal. --Extraocular muscles: Normal. Visualized sinuses:  No fluid levels or advanced mucosal thickening. Soft tissues: Normal. IMPRESSION: 1. Acute on chronic demyelinating disease, most commonly indicating multiple sclerosis. There are at least 4 active demyelinating lesions. 2. Abnormal contrast enhancement of the left optic nerve consistent with optic neuritis. Electronically Signed   By: Deatra Robinson M.D.   On: 12/29/2020 21:36   MR ORBITS W WO CONTRAST  Result Date: 12/29/2020 CLINICAL DATA:  Left eye vision loss EXAM: MRI HEAD AND ORBITS WITHOUT AND WITH CONTRAST TECHNIQUE: Multiplanar, multiecho pulse sequences of the brain and surrounding structures were obtained without and with intravenous contrast. Multiplanar, multiecho pulse sequences of the orbits and surrounding structures were obtained  including fat saturation techniques, before and after intravenous contrast administration. CONTRAST:  51mL GADAVIST GADOBUTROL 1 MMOL/ML IV SOLN COMPARISON:  None. FINDINGS: MRI HEAD FINDINGS Brain: No acute infarct, mass effect or extra-axial collection. No acute or chronic hemorrhage. There are multiple pericallosal and periventricular white matter lesions within both hemispheres, left-greater-than-right. There are approximately 10-20 lesions. There is mild diffusion restriction associated with multiple lesions there are contrast enhancing lesions in the superior left parietal lobe, anterior left frontal lobe and at the left parietal temporal junction. No enhancing lesions in the right hemisphere. The midline structures are normal. Vascular: Major flow voids are preserved. Skull and upper cervical spine: Normal calvarium and skull base. Visualized upper cervical spine and soft tissues are normal. MRI ORBITS FINDINGS Orbits: --Globes: Normal. --Bony orbit: Normal. --Preseptal soft tissues: Normal. --Intra- and extraconal orbital fat: Normal. No inflammatory stranding. --Optic nerves: There is abnormal contrast enhancement of the left optic nerve within the left orbit. The right optic nerve is normal. --Lacrimal glands and fossae: Normal. --Extraocular muscles: Normal. Visualized sinuses:  No fluid levels or advanced mucosal thickening. Soft tissues: Normal. IMPRESSION: 1. Acute on chronic demyelinating disease, most commonly indicating multiple sclerosis. There are at least 4 active demyelinating lesions. 2. Abnormal contrast enhancement of the left optic nerve consistent with optic neuritis. Electronically Signed   By: Deatra Robinson M.D.   On: 12/29/2020 21:36      Assessment/Plan Principal Problem:   Optic neuritis    Left-sided  optic neuritis with MRI brain showing acute on chronic demyelinating lesions with at least for active demyelinating lesions.  Discussed with neurologist.  At this time plan is to  get MRI of the C-spine and T-spine with and without contrast.  Patient has been started on IV Solu-Medrol 1000 mg every 24 hours for 5 days with a PPI and calcium channel vitamin D and sliding scale coverage. History of tobacco abuse advised about quitting.  Patient states she drinks tequila every other day.  Closely monitor for any withdrawal.  Thiamine.   DVT prophylaxis: Lovenox.  Code Status: Full code. Family Communication: Discussed with patient. Disposition Plan: Home. Consults called: Neurologist. Admission status: Observation.   Eduard Clos MD [facility] Pager [phone].  If 7PM-7AM, please contact night-coverage [email or web address] Password [facility]  12/30/2020, 10:07 PM

## 2020-12-30 NOTE — Consult Note (Addendum)
NEUROLOGY CONSULTATION NOTE   Date of service: December 30, 2020 Patient Name: Sydney Contreras MRN:  [id number] DOB:  July 29, 1990 Reason for consult: "R optic neuritis" Requesting Provider: Eduard Clos, MD _ _ _   _ __   _ __ _ _  __ __   _ __   __ _  History of Present Illness  Sydney Contreras is a 30 y.o. female with obesity, smokingwho presents with L eye blurred vision.  Reported that she woke up with blurred vision in left eye on 12/26/20. Thought that this might have been from her 30 y/o kid possibly messing around when she was asleep. This persisted for 2 days. She washed her eyes with water on Friday when it seemed like her vision got dark and really bad. She came to the ED. She had MRI brain and orbit which demonstrated abnormal contrast enhancement of the left optic nerve consistent with optic neuritis. MRI also demosntrated acute on chronic demyelinating disease most commonly indicating multiple sclerosis with atleast 4 active demyelinating lesions. She left for home to take care of her kid but returned today for further evaluation and treatment.  No fever, no signs of URI, UTI or Gastroenteritis over the last few weeks. No personal or family hx of MS.   ROS   Constitutional Denies weight loss, fever and chills.   HEENT Denies changes in vision and hearing.   Respiratory Denies SOB and cough.   CV Denies palpitations and CP   GI Denies abdominal pain, nausea, vomiting and diarrhea.   GU Denies dysuria and urinary frequency.   MSK Denies myalgia and joint pain.   Skin Denies rash and pruritus.   Neurological Denies headache and syncope.   Psychiatric Denies recent changes in mood. Denies anxiety and depression.    Past History   Past Medical History:  Diagnosis Date   Medical history non-contributory    Past Surgical History:  Procedure Laterality Date   NO PAST SURGERIES     History reviewed. No pertinent family history. Social History   Socioeconomic History    Marital status: Single    Spouse name: Not on file   Number of children: Not on file   Years of education: Not on file   Highest education level: Not on file  Occupational History   Not on file  Tobacco Use   Smoking status: Every Day   Smokeless tobacco: Never  Substance and Sexual Activity   Alcohol use: Not on file   Drug use: Not on file   Sexual activity: Not on file  Other Topics Concern   Not on file  Social History Narrative   Lives with her 48 y/o son   Social Determinants of Health   Financial Resource Strain: Not on file  Food Insecurity: Not on file  Transportation Needs: Not on file  Physical Activity: Not on file  Stress: Not on file  Social Connections: Not on file   No Known Allergies  Medications  (Not in a hospital admission)    Vitals   Vitals:   12/30/20 1303 12/30/20 1600 12/30/20 1931  BP: (!) 143/82 132/72 124/73  Pulse: 89 92 72  Resp: 17 17 16   Temp: 98.4 F (36.9 C) 98.8 F (37.1 C) 98.6 F (37 C)  TempSrc: Oral Oral Oral  SpO2: 100% 100% 100%     There is no height or weight on file to calculate BMI.  Physical Exam   General: Laying comfortably in bed; in no  acute distress.  HENT: Normal oropharynx and mucosa. Normal external appearance of ears and nose.  Neck: Supple, no pain or tenderness  CV: No JVD. No peripheral edema.  Pulmonary: Symmetric Chest rise. Normal respiratory effort.  Abdomen: Soft to touch, non-tender. Ext: No cyanosis, edema, or deformity  Skin: No rash. Normal palpation of skin.   Musculoskeletal: Normal digits and nails by inspection. No clubbing.   Neurologic Examination  Mental status/Cognition: Alert, oriented to self, place, month and year, good attention.  Speech/language: Fluent, comprehension intact, object naming intact, repetition intact. Cranial nerves:   CN II Pupils equal and reactive to light, no VF deficits    CN III,IV,VI EOM intact, no gaze preference or deviation, no nystagmus    CN V  normal sensation in V1, V2, and V3 segments bilaterally    CN VII no asymmetry, no nasolabial fold flattening    CN VIII normal hearing to speech   CN IX & X normal palatal elevation, no uvular deviation    CN XI 5/5 head turn and 5/5 shoulder shrug bilaterally    CN XII midline tongue protrusion    Motor:  Muscle bulk: normal, tone normal, pronator drift none tremor none Mvmt Root Nerve  Muscle Right Left Comments  SA C5/6 Ax Deltoid 5 5   EF C5/6 Mc Biceps 5 5   EE C6/7/8 Rad Triceps 5 5   WF C6/7 Med FCR     WE C7/8 PIN ECU     F Ab C8/T1 U ADM/FDI 5 5   HF L1/2/3 Fem Illopsoas 4+ 4+   KE L2/3/4 Fem Quad 5 5   DF L4/5 D Peron Tib Ant 5 5   PF S1/2 Tibial Grc/Sol 5 5    Reflexes:  Right Left Comments  Pectoralis      Biceps (C5/6) 2 2   Brachioradialis (C5/6) 2 2    Triceps (C6/7) 2 2    Patellar (L3/4) 2 2    Achilles (S1)      Hoffman      Plantar     Jaw jerk    Sensation:  Light touch intact   Pin prick    Temperature    Vibration   Proprioception    Coordination/Complex Motor:  - Finger to Nose intact BL - Heel to shin intact BL - Rapid alternating movement are normal - Gait: Stride length normal. Arm swing normal. Base width narrow. Able to toe walk and heel walk.  Labs   CBC:  Recent Labs  Lab 12/30/20 2017  WBC 13.6*  NEUTROABS 8.5*  HGB 13.6  HCT 41.8  MCV 85.8  PLT 325    Basic Metabolic Panel:  Lab Results  Component Value Date   NA 137 12/29/2020   K 3.7 12/29/2020   CO2 25 12/29/2020   GLUCOSE 115 (H) 12/29/2020   BUN 16 12/29/2020   CREATININE 0.96 12/29/2020   CALCIUM 9.2 12/29/2020   GFRNONAA >60 12/29/2020   Lipid Panel: No results found for: LDLCALC HgbA1c: No results found for: HGBA1C Urine Drug Screen: No results found for: LABOPIA, COCAINSCRNUR, LABBENZ, AMPHETMU, THCU, LABBARB  Alcohol Level No results found for: ETH  MR orbits w + w/o C(personally reviewed): Abnormal contrast enhancement of the left optic nerve  consistent with optic neuritis.  MRI Brain w + w/o C(personally reviewed): Acute on chronic demyelinating disease, most commonly indicating multiple sclerosis. There are at least 4 active demyelinating lesions.   Impression   Sydney Contreras is a  30 y.o. female with PMH significant for obesity and smoking who presents with L eye optic neuritis and atleast 4 more contrast enhancing lesions ion MRI Brain. Her neurologic examination is notable for R ye decreased visual acuity.  Imaging and presentation fits the Mcdonalds criteria for dissemination in time and space. Likely this is Multiple sclerosis.  Recommendations  - MRI C, T spine with and without contrast - IV solumedrol 1000mg  Q24 hours x 5 doses - PPI and Calcium + Vit D while on steroids. - Follow up with neurology outpatient with Dr. with [facility]. - Serum AQP4 Ab + MoG Ab. ___________________________________________________________________  Plan discussed with Dr. [facility] over secure chat.  Thank you for the opportunity to take part in the care of this patient. If you have any further questions, please contact the neurology consultation attending.  Signed,  Toniann Fail [facility] Pager Number Erick Blinks _ _ _   _ __   _ __ _ _  __ __   _ __   __ _

## 2020-12-30 NOTE — ED Provider Notes (Signed)
[facility] EMERGENCY DEPARTMENT Provider Note   CSN: [id number] Arrival date & time: 12/30/20  1222     History No chief complaint on file.   Sydney Contreras is a 30 y.o. female with no prior past medical history the presents the emerge department today for evaluation of vision loss.  Patient came to the emergency department yesterday, however left without being seen.  MRI of orbits and brain were obtained which showed acute on chronic demyelinating disease, most likely multiple sclerosis.  There are 4 active demyelinating lesions.  Concern for optic neuritis.  Patient was contacted yesterday to return back to the emergency department, did return this morning, unfortunately patient has been waiting in the waiting room for 6-1/2 hours.  At that time neurology was consulted and recommended IV steroids and admission.  Patient states that her symptoms have not changed today.  Symptoms started on Wednesday, started as blurry vision and ocular pain of her left eye eye, worsening throughout the week and started developing a complete vision loss on her left eye 2 days ago.  Currently states that her vision is black, states that it is like a film and can barely see objects through that eye.  Also complains of ocular pain.  Denies any history of a mass.  Denies any headache or fevers.  Denies any eye trauma.  HPI     Past Medical History:  Diagnosis Date   Medical history non-contributory     Patient Active Problem List   Diagnosis Date Noted   Optic neuritis 12/30/2020    Past Surgical History:  Procedure Laterality Date   NO PAST SURGERIES       OB History   No obstetric history on file.     History reviewed. No pertinent family history.  Social History   Tobacco Use   Smoking status: Every Day   Smokeless tobacco: Never    Home Medications Prior to Admission medications   Medication Sig Start Date End Date Taking? Authorizing Provider  erythromycin  ophthalmic ointment Place a 1/2 inch ribbon of ointment into the lower eyelid. 12/28/20   Raspet, Noberto Retort, PA-C    Allergies    Patient has no known allergies.  Review of Systems   Review of Systems  Constitutional:  Negative for chills, diaphoresis, fatigue and fever.  HENT:  Negative for congestion, sore throat and trouble swallowing.   Eyes:  Positive for photophobia, pain and visual disturbance.  Respiratory:  Negative for cough, shortness of breath and wheezing.   Cardiovascular:  Negative for chest pain, palpitations and leg swelling.  Gastrointestinal:  Negative for abdominal distention, abdominal pain, diarrhea, nausea and vomiting.  Genitourinary:  Negative for difficulty urinating.  Musculoskeletal:  Negative for back pain, neck pain and neck stiffness.  Skin:  Negative for pallor.  Neurological:  Negative for dizziness, speech difficulty, weakness and headaches.  Psychiatric/Behavioral:  Negative for confusion.    Physical Exam Updated Vital Signs BP 124/73 (BP Location: Left Arm)   Pulse 72   Temp 98.6 F (37 C) (Oral)   Resp 16   SpO2 100%   Physical Exam Constitutional:      General: She is not in acute distress.    Appearance: Normal appearance. She is not ill-appearing, toxic-appearing or diaphoretic.  HENT:     Mouth/Throat:     Mouth: Mucous membranes are moist.     Pharynx: Oropharynx is clear.  Eyes:     General: No scleral icterus.  Extraocular Movements: Extraocular movements intact.     Right eye: Normal extraocular motion and no nystagmus.     Left eye: Normal extraocular motion and no nystagmus.     Pupils: Pupils are equal, round, and reactive to light.     Comments: Patient with ocular pain to left eye, normal EOMs.  Patient's, pupils are able to constrict however patient does have afferent pupillary defect on left side.  Patient is able to see my hands, ever states that there is a black foam over her eye.  Cardiovascular:     Rate and Rhythm:  Normal rate and regular rhythm.     Pulses: Normal pulses.     Heart sounds: Normal heart sounds.  Pulmonary:     Effort: Pulmonary effort is normal. No respiratory distress.     Breath sounds: Normal breath sounds. No stridor. No wheezing, rhonchi or rales.  Chest:     Chest wall: No tenderness.  Abdominal:     General: Abdomen is flat. There is no distension.     Palpations: Abdomen is soft.     Tenderness: There is no abdominal tenderness. There is no guarding or rebound.  Musculoskeletal:        General: No swelling or tenderness. Normal range of motion.     Cervical back: Normal range of motion and neck supple. No rigidity.     Right lower leg: No edema.     Left lower leg: No edema.  Skin:    General: Skin is warm and dry.     Capillary Refill: Capillary refill takes less than 2 seconds.     Coloration: Skin is not pale.  Neurological:     General: No focal deficit present.     Mental Status: She is alert and oriented to person, place, and time.     Cranial Nerves: No cranial nerve deficit.     Sensory: No sensory deficit.     Motor: No weakness.     Coordination: Coordination normal.  Psychiatric:        Mood and Affect: Mood normal.        Behavior: Behavior normal.    ED Results / Procedures / Treatments   Labs (all labs ordered are listed, but only abnormal results are displayed) Labs Reviewed  CBC WITH DIFFERENTIAL/PLATELET - Abnormal; Notable for the following components:      Result Value   WBC 13.6 (*)    Neutro Abs 8.5 (*)    Monocytes Absolute 1.2 (*)    Abs Immature Granulocytes 0.08 (*)    All other components within normal limits  RESP PANEL BY RT-PCR (FLU A&B, COVID) ARPGX2  NEUROMYELITIS OPTICA AUTOAB, IGG  MISC [facility] TEST (SEND OUT)    EKG None  Radiology MR Brain W and Wo Contrast  Result Date: 12/29/2020 CLINICAL DATA:  Left eye vision loss EXAM: MRI HEAD AND ORBITS WITHOUT AND WITH CONTRAST TECHNIQUE: Multiplanar, multiecho pulse  sequences of the brain and surrounding structures were obtained without and with intravenous contrast. Multiplanar, multiecho pulse sequences of the orbits and surrounding structures were obtained including fat saturation techniques, before and after intravenous contrast administration. CONTRAST:  41mL GADAVIST GADOBUTROL 1 MMOL/ML IV SOLN COMPARISON:  None. FINDINGS: MRI HEAD FINDINGS Brain: No acute infarct, mass effect or extra-axial collection. No acute or chronic hemorrhage. There are multiple pericallosal and periventricular white matter lesions within both hemispheres, left-greater-than-right. There are approximately 10-20 lesions. There is mild diffusion restriction associated with multiple lesions there are  contrast enhancing lesions in the superior left parietal lobe, anterior left frontal lobe and at the left parietal temporal junction. No enhancing lesions in the right hemisphere. The midline structures are normal. Vascular: Major flow voids are preserved. Skull and upper cervical spine: Normal calvarium and skull base. Visualized upper cervical spine and soft tissues are normal. MRI ORBITS FINDINGS Orbits: --Globes: Normal. --Bony orbit: Normal. --Preseptal soft tissues: Normal. --Intra- and extraconal orbital fat: Normal. No inflammatory stranding. --Optic nerves: There is abnormal contrast enhancement of the left optic nerve within the left orbit. The right optic nerve is normal. --Lacrimal glands and fossae: Normal. --Extraocular muscles: Normal. Visualized sinuses:  No fluid levels or advanced mucosal thickening. Soft tissues: Normal. IMPRESSION: 1. Acute on chronic demyelinating disease, most commonly indicating multiple sclerosis. There are at least 4 active demyelinating lesions. 2. Abnormal contrast enhancement of the left optic nerve consistent with optic neuritis. Electronically Signed   By: Deatra Robinson M.D.   On: 12/29/2020 21:36   MR ORBITS W WO CONTRAST  Result Date:  12/29/2020 CLINICAL DATA:  Left eye vision loss EXAM: MRI HEAD AND ORBITS WITHOUT AND WITH CONTRAST TECHNIQUE: Multiplanar, multiecho pulse sequences of the brain and surrounding structures were obtained without and with intravenous contrast. Multiplanar, multiecho pulse sequences of the orbits and surrounding structures were obtained including fat saturation techniques, before and after intravenous contrast administration. CONTRAST:  6mL GADAVIST GADOBUTROL 1 MMOL/ML IV SOLN COMPARISON:  None. FINDINGS: MRI HEAD FINDINGS Brain: No acute infarct, mass effect or extra-axial collection. No acute or chronic hemorrhage. There are multiple pericallosal and periventricular white matter lesions within both hemispheres, left-greater-than-right. There are approximately 10-20 lesions. There is mild diffusion restriction associated with multiple lesions there are contrast enhancing lesions in the superior left parietal lobe, anterior left frontal lobe and at the left parietal temporal junction. No enhancing lesions in the right hemisphere. The midline structures are normal. Vascular: Major flow voids are preserved. Skull and upper cervical spine: Normal calvarium and skull base. Visualized upper cervical spine and soft tissues are normal. MRI ORBITS FINDINGS Orbits: --Globes: Normal. --Bony orbit: Normal. --Preseptal soft tissues: Normal. --Intra- and extraconal orbital fat: Normal. No inflammatory stranding. --Optic nerves: There is abnormal contrast enhancement of the left optic nerve within the left orbit. The right optic nerve is normal. --Lacrimal glands and fossae: Normal. --Extraocular muscles: Normal. Visualized sinuses:  No fluid levels or advanced mucosal thickening. Soft tissues: Normal. IMPRESSION: 1. Acute on chronic demyelinating disease, most commonly indicating multiple sclerosis. There are at least 4 active demyelinating lesions. 2. Abnormal contrast enhancement of the left optic nerve consistent with optic  neuritis. Electronically Signed   By: Deatra Robinson M.D.   On: 12/29/2020 21:36    Procedures Procedures   Medications Ordered in ED Medications  methylPREDNISolone sodium succinate (SOLU-MEDROL) 1,000 mg in sodium chloride 0.9 % 50 mL IVPB (has no administration in time range)    And  pantoprazole (PROTONIX) injection 40 mg (has no administration in time range)  calcium citrate-vitamin D 500-500 MG-UNIT per chewable tablet 1 tablet (has no administration in time range)    ED Course  I have reviewed the triage vital signs and the nursing notes.  Pertinent labs & imaging results that were available during my care of the patient were reviewed by me and considered in my medical decision making (see chart for details).    MDM Rules/Calculators/A&P  Patient presents today for new diagnosis of MS, does have optic neuritis flare.  Dr. Larina Earthly, neurology consulted, requesting IV steroids and admission at this time.  Patient to be admitted.  Patient admitted to Dr. Toniann Fail.  The patient appears reasonably stabilized for admission considering the current resources, flow, and capabilities available in the ED at this time, and I doubt any other [facility] requiring further screening and/or treatment in the ED prior to admission.  Final Clinical Impression(s) / ED Diagnoses Final diagnoses:  Optic neuritis   Rx / DC Orders ED Discharge Orders     None        Farrel Gordon, PA-C 12/30/20 2157    Tilden Fossa, MD 12/31/20 1104

## 2020-12-30 NOTE — ED Provider Notes (Signed)
As well as reviewing work-ups in the waiting room, I noticed that patient had MRI findings consistent with optic neuritis and new multiple sclerosis.  Attempts were made to find patient in her room.  I spoke with Dr. Derry Lory, from neurology, advised patient be brought in for IV steroids.  When we attempted to find the patient on the track for, it was noted that she had left.  I contacted the patient by telephone and verified her identity using full name and date of birth.  I did deliver her MRI results.  I urged her to return to the emergency department for treatment and admission.  She states that she cannot return immediately due to childcare problems, but will return first thing in the morning.  I did discuss with her that delay in treatment can lead to worsening outcomes.  Patient told me on the phone that her vision has gone black.    I again urged her to come to the ER and told her that her vision loss could become permanent. She acknowledged this and will return in the morning.   Roxy Horseman, PA-C 12/30/20 0158    Zadie Rhine, MD 12/30/20 [phone]

## 2020-12-31 ENCOUNTER — Observation Stay (HOSPITAL_COMMUNITY): Payer: PRIVATE HEALTH INSURANCE

## 2020-12-31 DIAGNOSIS — G35 Multiple sclerosis: Secondary | ICD-10-CM | POA: Diagnosis not present

## 2020-12-31 DIAGNOSIS — H469 Unspecified optic neuritis: Secondary | ICD-10-CM | POA: Diagnosis not present

## 2020-12-31 LAB — CBC
HCT: 41.3 % (ref 36.0–46.0)
Hemoglobin: 13.2 g/dL (ref 12.0–15.0)
MCH: 27.3 pg (ref 26.0–34.0)
MCHC: 32 g/dL (ref 30.0–36.0)
MCV: 85.3 fL (ref 80.0–100.0)
Platelets: 330 10*3/uL (ref 150–400)
RBC: 4.84 MIL/uL (ref 3.87–5.11)
RDW: 15.4 % (ref 11.5–15.5)
WBC: 14.3 10*3/uL — ABNORMAL HIGH (ref 4.0–10.5)
nRBC: 0 % (ref 0.0–0.2)

## 2020-12-31 LAB — BASIC METABOLIC PANEL
Anion gap: 8 (ref 5–15)
BUN: 10 mg/dL (ref 6–20)
CO2: 22 mmol/L (ref 22–32)
Calcium: 9.2 mg/dL (ref 8.9–10.3)
Chloride: 105 mmol/L (ref 98–111)
Creatinine, Ser: 0.87 mg/dL (ref 0.44–1.00)
GFR, Estimated: >60 mL/min (ref >60)
Glucose, Bld: 136 mg/dL — ABNORMAL HIGH (ref 70–99)
Potassium: 4.1 mmol/L (ref 3.5–5.1)
Sodium: 135 mmol/L (ref 135–145)

## 2020-12-31 LAB — HIV ANTIBODY (ROUTINE TESTING W REFLEX): HIV Screen 4th Generation wRfx: NONREACTIVE

## 2020-12-31 LAB — GLUCOSE, CAPILLARY
Glucose-Capillary: 192 mg/dL — ABNORMAL HIGH (ref 70–99)
Glucose-Capillary: 249 mg/dL — ABNORMAL HIGH (ref 70–99)
Glucose-Capillary: 178 mg/dL — ABNORMAL HIGH (ref 70–99)
Glucose-Capillary: 199 mg/dL — ABNORMAL HIGH (ref 70–99)

## 2020-12-31 IMAGING — MR MR THORACIC SPINE WO/W CM
5 of 9 series · 23 of 48 positions shown · IV contrast (gadavist)
Comparison: None.

CLINICAL DATA: Baseline scanning for multiple sclerosis. Provided
history of intracranial hypotension but not noted in the chart.

EXAM:
MRI CERVICAL AND THORACIC SPINE WITHOUT AND WITH CONTRAST
TECHNIQUE: Multiplanar and multiecho pulse sequences of the cervical spine, to
include the craniocervical junction and cervicothoracic junction,
and the thoracic spine, were obtained without and with intravenous
contrast.
CONTRAST:  10mL GADAVIST GADOBUTROL 1 MMOL/ML IV SOLN

[Series 18: T1 · sagittal · 6.0mm · 1.23mm/px · 1 of 9 slices shown (1 of 3)]
[im 1/9]
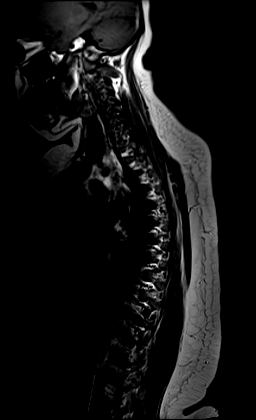

[Series 19: T2 · sagittal · 3.0mm · 0.76mm/px · 3 of 17 slices shown (1 of 2)]
[im 1/17]
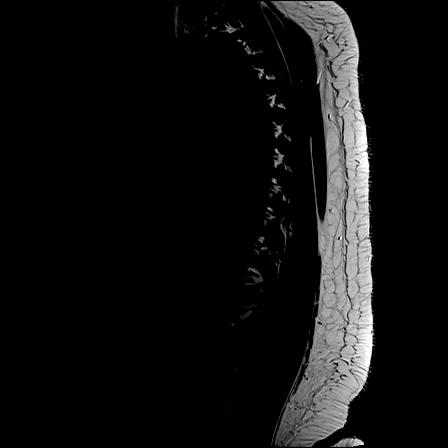
[im 9/17]
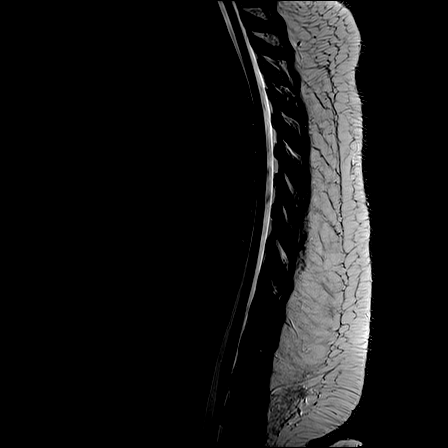
[im 17/17]
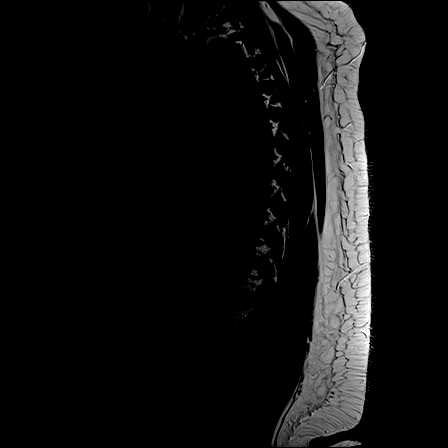

[Series 20: T1 · sagittal · 3.0mm · 0.76mm/px · 4 of 17 slices shown (2 of 3)]
[im 1/17]
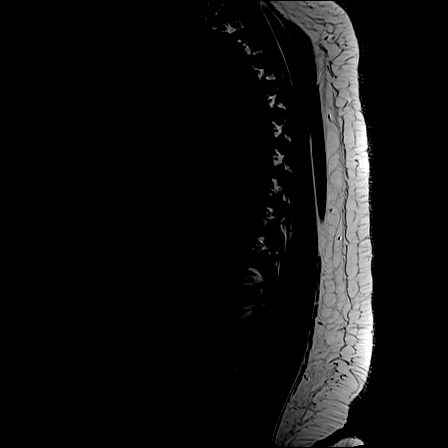
[im 6/17]
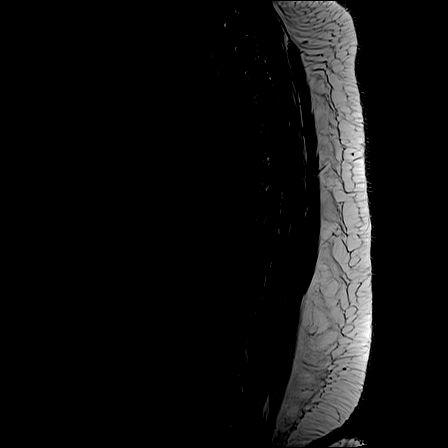
[im 11/17]
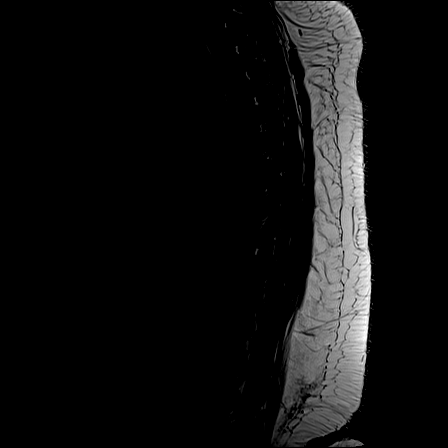
[im 17/17]
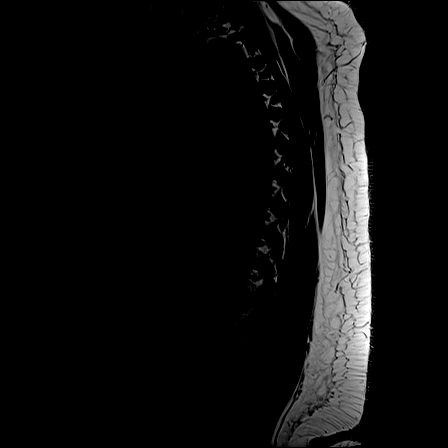

[Series 22: T2 · axial · 4.0mm · 0.59mm/px · z∈[-256,-50]mm · 8 of 36 slices shown (2 of 2)]
[im 1/36]
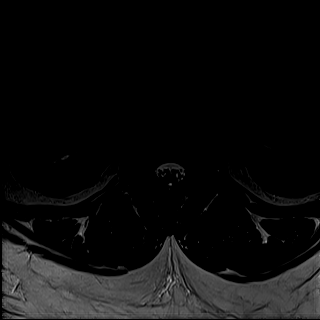
[im 6/36]
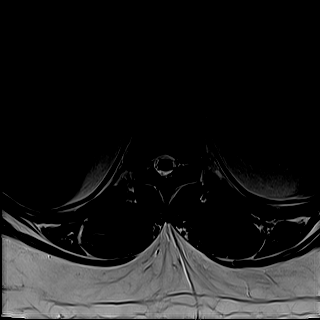
[im 11/36]
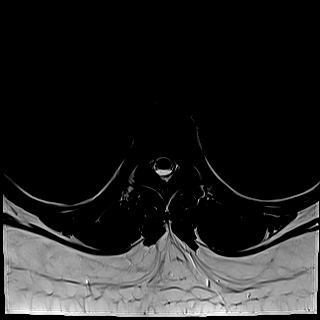
[im 16/36]
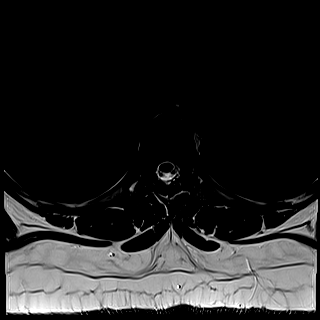
[im 21/36]
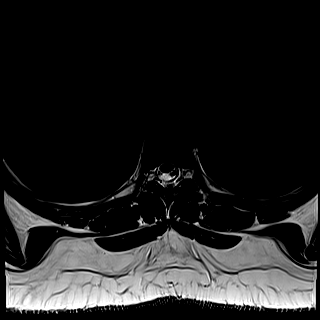
[im 26/36]
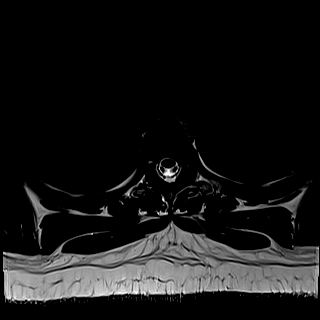
[im 31/36]
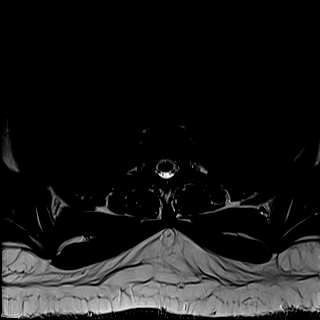
[im 36/36]
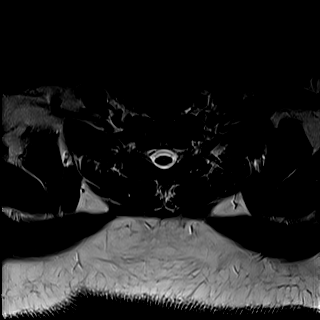

[Series 24: T1 · axial · non-contrast · 4.0mm · 0.31mm/px · z∈[-256,-79]mm · 7 of 36 slices shown (3 of 3)]
[im 1/36]
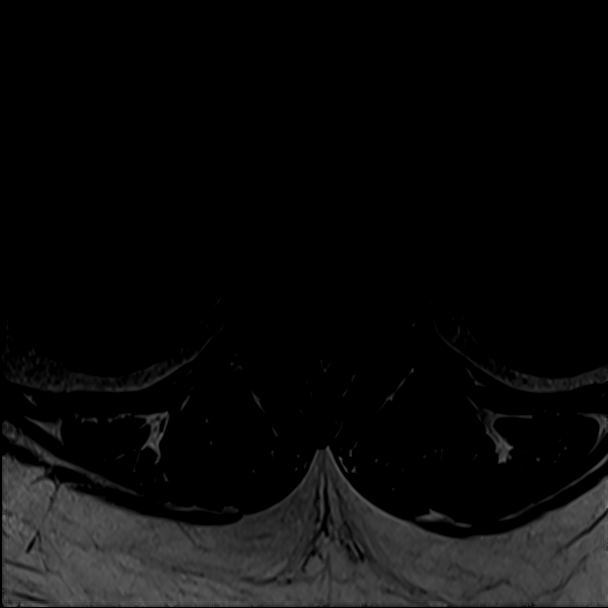
[im 6/36]
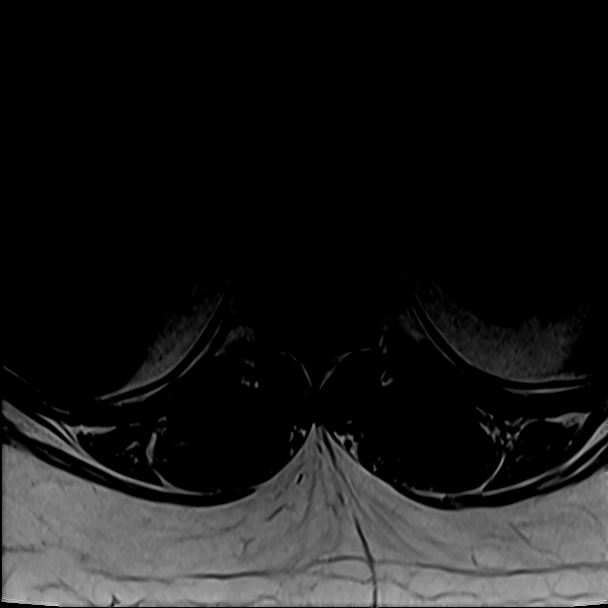
[im 11/36]
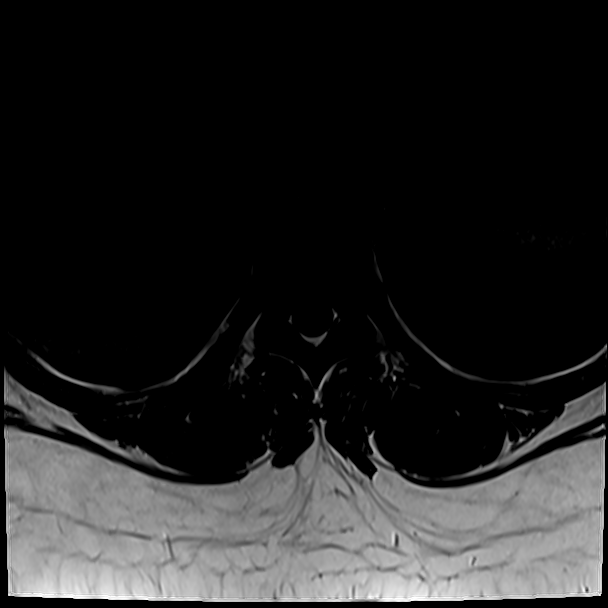
[im 16/36]
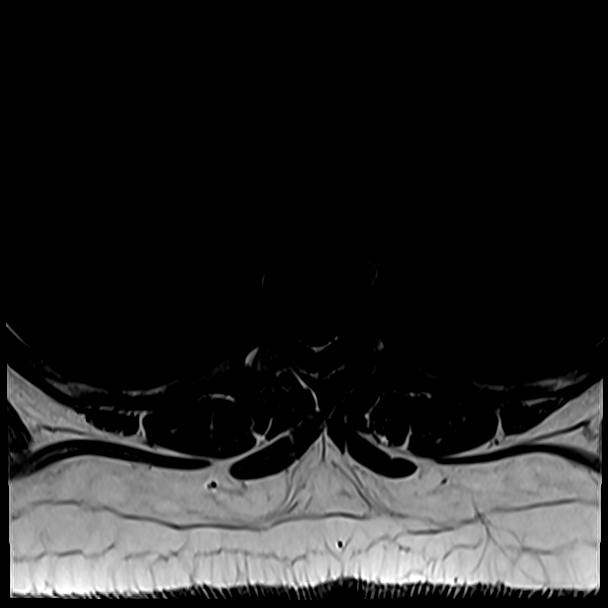
[im 21/36]
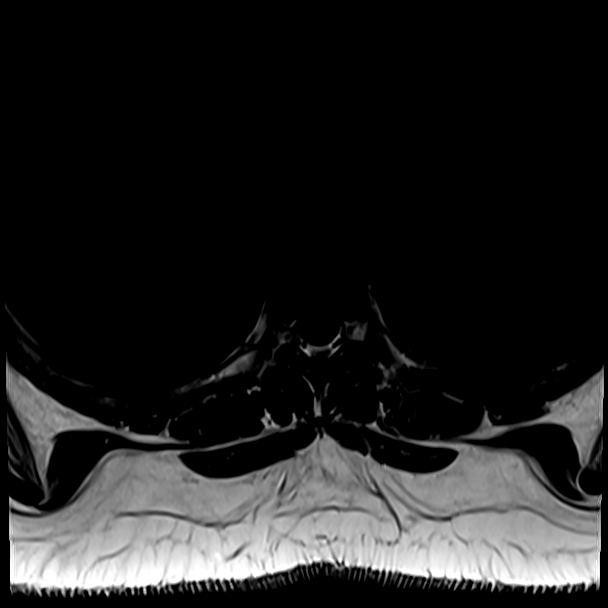
[im 26/36]
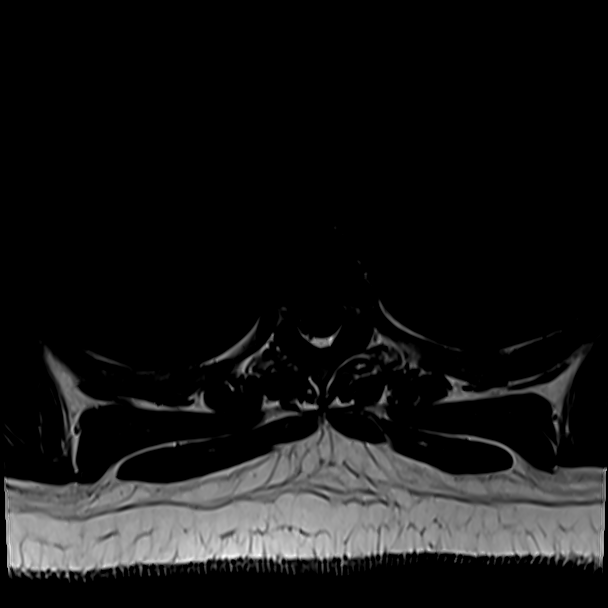
[im 31/36]
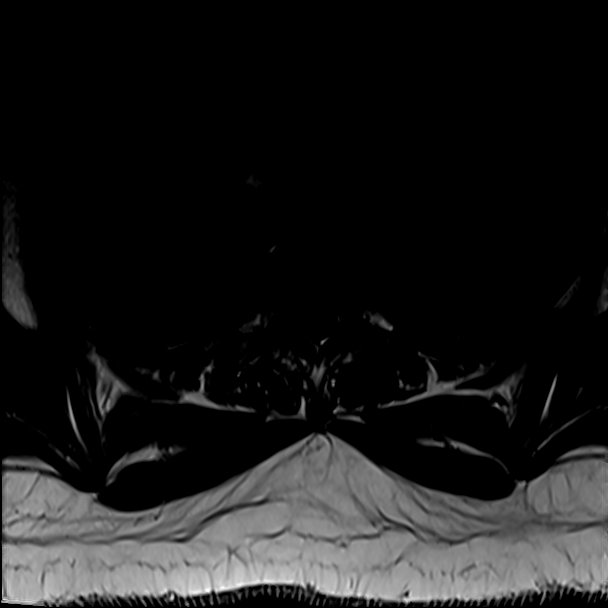

[23 of 48 positions shown; findings below may reference images not displayed]

FINDINGS: MRI CERVICAL SPINE FINDINGS

Alignment: Physiologic

Vertebrae: No fracture, evidence of discitis, or bone lesion.

Cord: Short segment cord signal abnormalities which are nonenhancing
but have a somewhat rounded/slightly swollen appearance:

1. Central to posterior cord at C2
2. Central the posterior cord at C3
3. Posterior cord at C4
4. Central to right cord at C5

Posterior Fossa, vertebral arteries, paraspinal tissues: Negative

Disc levels:

No degenerative changes or impingement.

MRI THORACIC SPINE FINDINGS

Alignment:  Physiologic.

Vertebrae: No fracture, evidence of discitis, or bone lesion.

Cord: None convincing small ventral T2 hyperintensity at the level
of T9-10 on sagittal STIR imaging. No abnormal enhancement, cord
swelling, or cord thinning.

Paraspinal and other soft tissues: Negative

Disc levels:

No herniation or impingement.
IMPRESSION: 1. Multiple foci of nonenhancing cord demyelination in the upper
cervical region.
2. No definite thoracic cord involvement.

## 2020-12-31 IMAGING — MR MR CERVICAL SPINE WO/W CM
6 of 8 series · 31 of 48 positions shown · IV contrast (gadavist)
Comparison: None.

CLINICAL DATA: Baseline scanning for multiple sclerosis. Provided
history of intracranial hypotension but not noted in the chart.

EXAM:
MRI CERVICAL AND THORACIC SPINE WITHOUT AND WITH CONTRAST
TECHNIQUE: Multiplanar and multiecho pulse sequences of the cervical spine, to
include the craniocervical junction and cervicothoracic junction,
and the thoracic spine, were obtained without and with intravenous
contrast.
CONTRAST:  10mL GADAVIST GADOBUTROL 1 MMOL/ML IV SOLN

[Series 5: T2 · sagittal · 3.0mm · 0.69mm/px · 4 of 15 slices shown (1 of 2)]
[im 1/15]
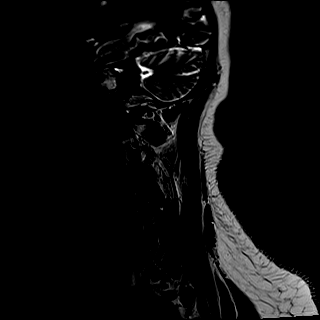
[im 5/15]
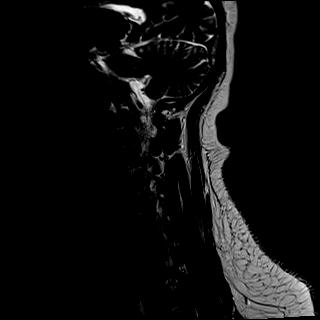
[im 10/15]
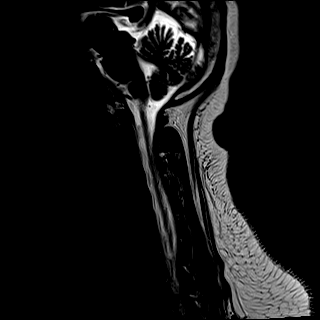
[im 15/15]
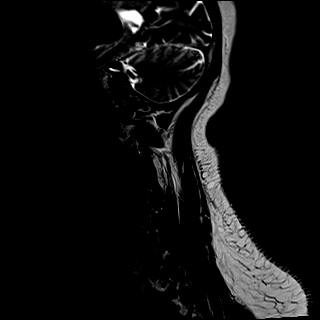

[Series 6: T1 · sagittal · 3.0mm · 0.69mm/px · 4 of 15 slices shown (1 of 2)]
[im 1/15]
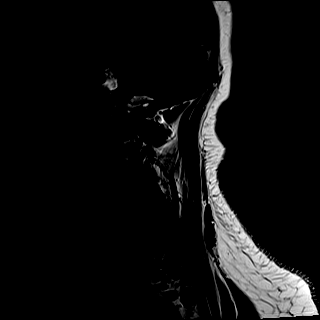
[im 5/15]
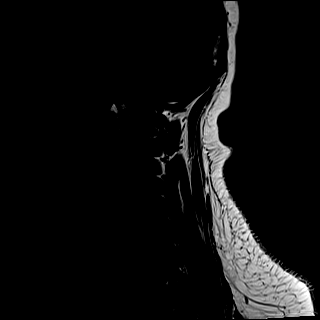
[im 10/15]
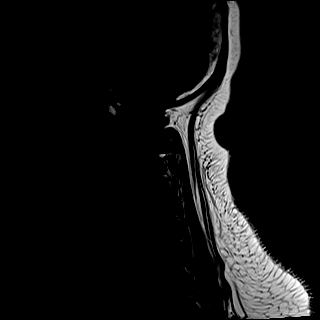
[im 15/15]
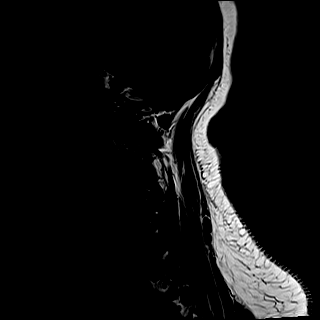

[Series 7: STIR · sagittal · 3.0mm · 0.86mm/px · 3 of 15 slices shown]
[im 1/15]
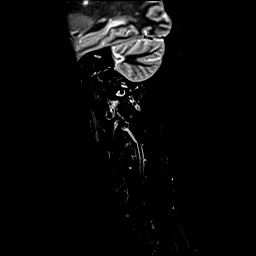
[im 5/15]
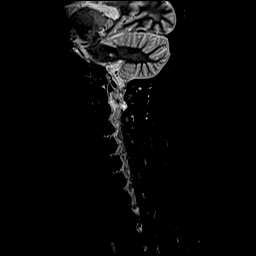
[im 10/15]
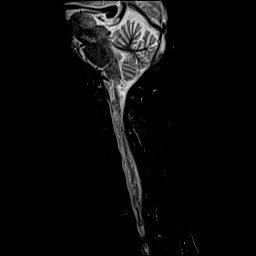

[Series 8: T2 · axial · 3.0mm · 0.66mm/px · z∈[-57,+51]mm · 8 of 36 slices shown (2 of 2)]
[im 1/36]
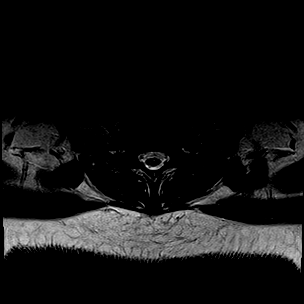
[im 6/36]
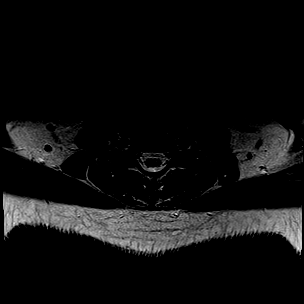
[im 11/36]
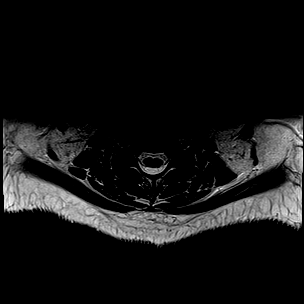
[im 16/36]
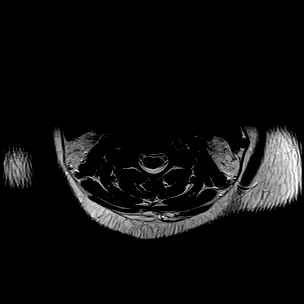
[im 21/36]
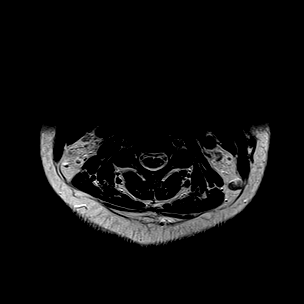
[im 26/36]
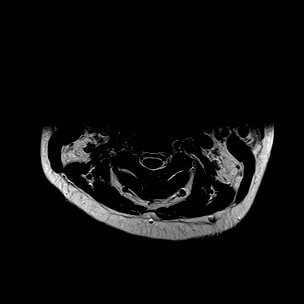
[im 31/36]
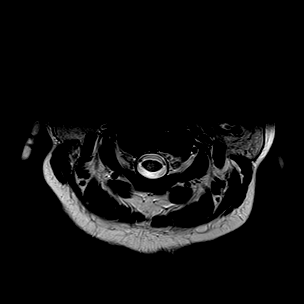
[im 36/36]
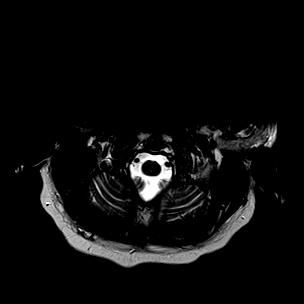

[Series 10: T1 · axial · 3.0mm · 0.39mm/px · z∈[-57,+51]mm · 8 of 36 slices shown (2 of 2)]
[im 1/36]
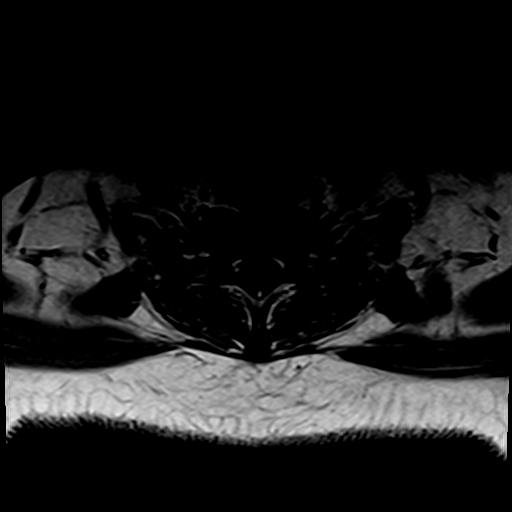
[im 6/36]
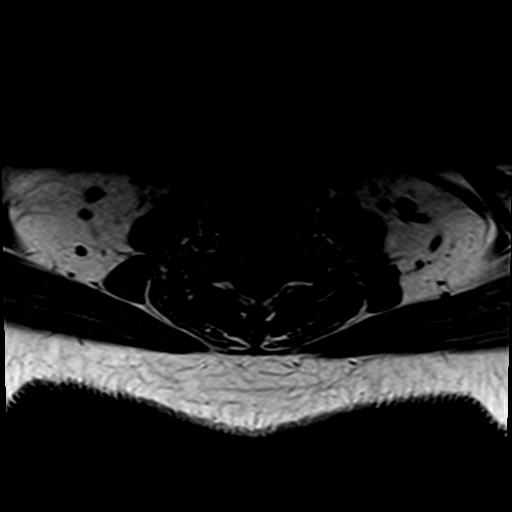
[im 11/36]
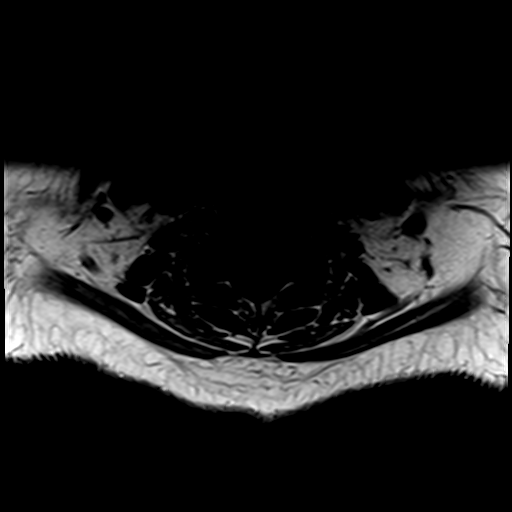
[im 16/36]
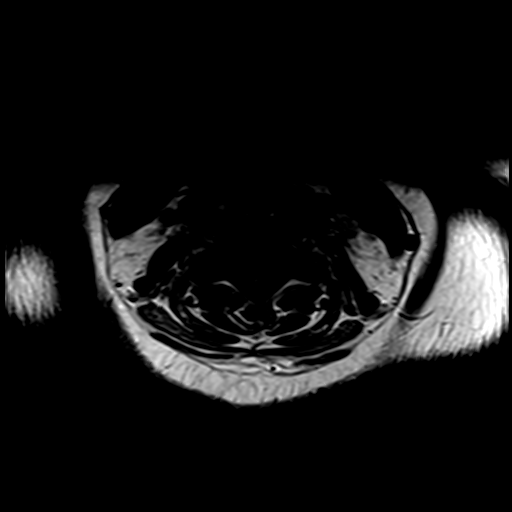
[im 21/36]
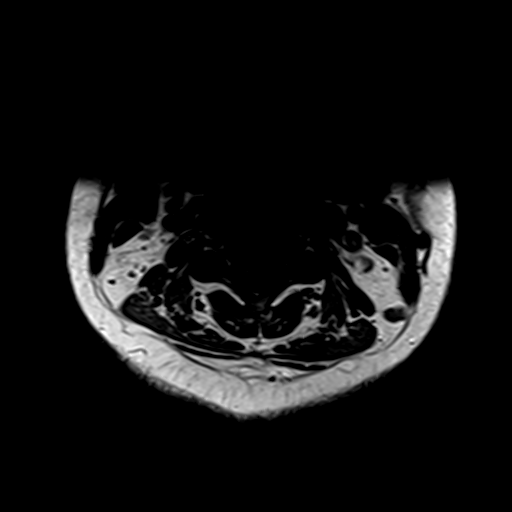
[im 26/36]
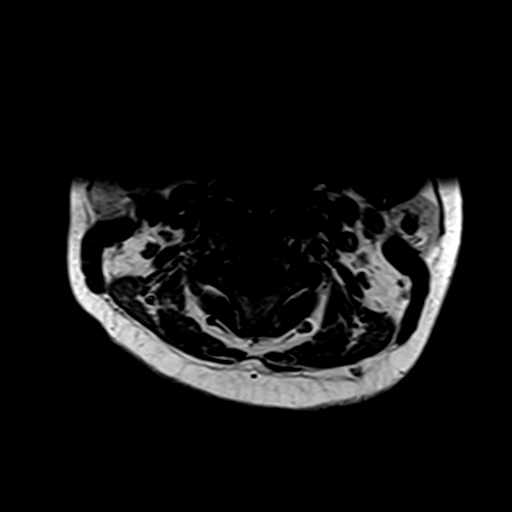
[im 31/36]
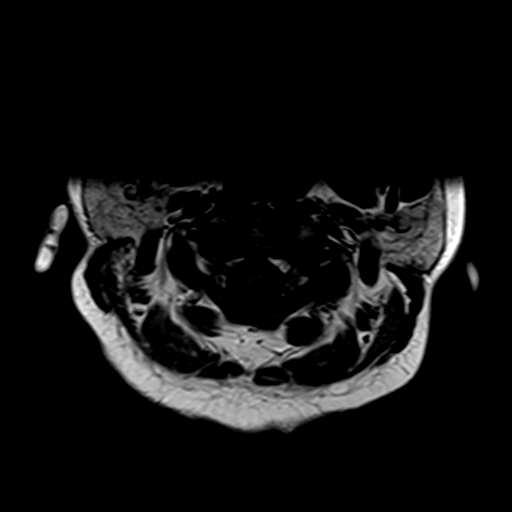
[im 36/36]
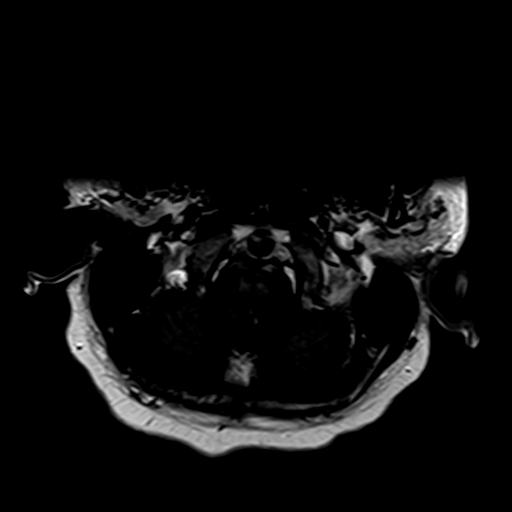

[Series 11: T1 fat-sat post-contrast · sagittal · 3.0mm · 0.43mm/px · 4 of 15 slices shown]
[im 1/15]
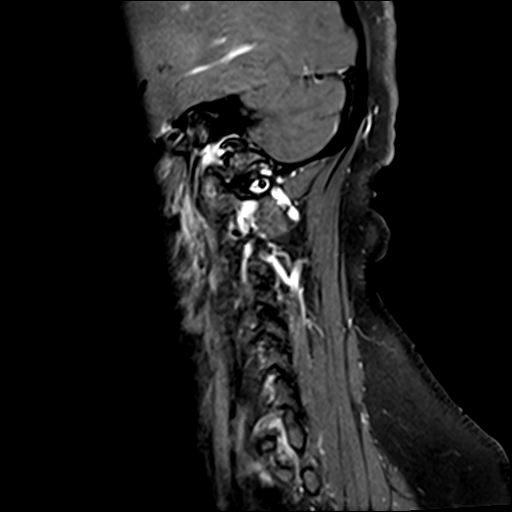
[im 5/15]
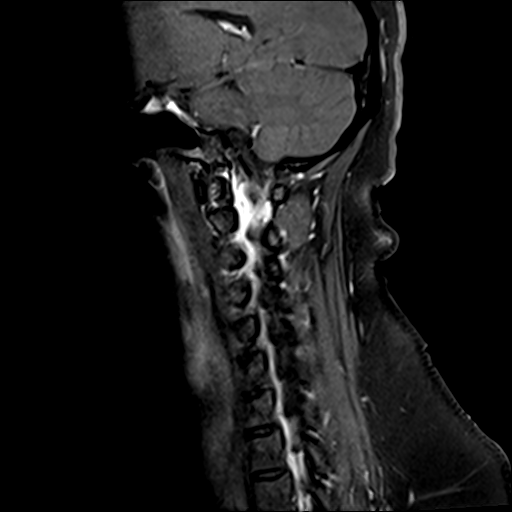
[im 10/15]
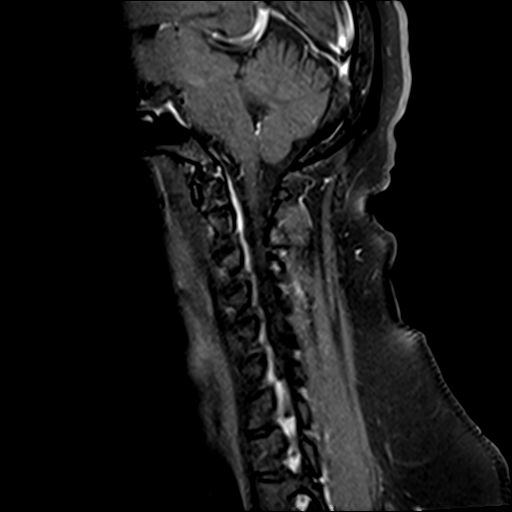
[im 15/15]
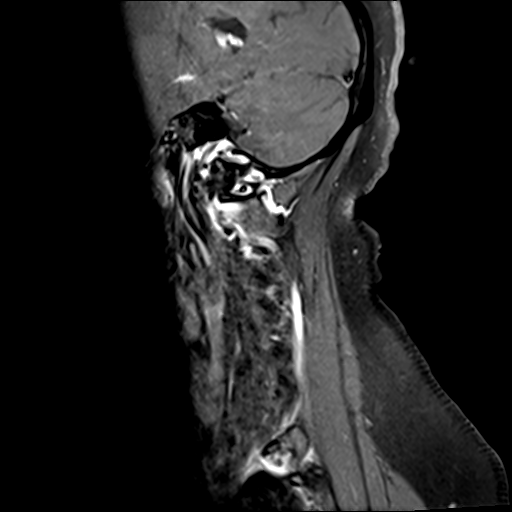

[31 of 48 positions shown; findings below may reference images not displayed]

FINDINGS: MRI CERVICAL SPINE FINDINGS

Alignment: Physiologic

Vertebrae: No fracture, evidence of discitis, or bone lesion.

Cord: Short segment cord signal abnormalities which are nonenhancing
but have a somewhat rounded/slightly swollen appearance:

1. Central to posterior cord at C2
2. Central the posterior cord at C3
3. Posterior cord at C4
4. Central to right cord at C5

Posterior Fossa, vertebral arteries, paraspinal tissues: Negative

Disc levels:

No degenerative changes or impingement.

MRI THORACIC SPINE FINDINGS

Alignment:  Physiologic.

Vertebrae: No fracture, evidence of discitis, or bone lesion.

Cord: None convincing small ventral T2 hyperintensity at the level
of T9-10 on sagittal STIR imaging. No abnormal enhancement, cord
swelling, or cord thinning.

Paraspinal and other soft tissues: Negative

Disc levels:

No herniation or impingement.
IMPRESSION: 1. Multiple foci of nonenhancing cord demyelination in the upper
cervical region.
2. No definite thoracic cord involvement.

## 2020-12-31 MED ORDER — GADOBUTROL 1 MMOL/ML IV SOLN
10.0000 mL | Freq: Once | INTRAVENOUS | Status: AC | PRN
  Administered 2020-12-31: 10 mL via INTRAVENOUS

## 2020-12-31 MED ORDER — CALCIUM CARBONATE-VITAMIN D 500-200 MG-UNIT PO TABS
1.0000 | ORAL_TABLET | Freq: Every day | ORAL | Status: DC
  Administered 2020-12-31: 1 via ORAL
  Filled 2020-12-31: qty 1

## 2020-12-31 NOTE — Progress Notes (Signed)
Patient Sydney Contreras is requesting to shower, states "I have been in the ED all day. I would really like to get these germs off."   Patient does have IV in R. Forearm and is still experiencing blurry vision in the L eye. Patient is otherwise, Ad-lib with 5/5 extremity strength. Patient highly desires to take a shower.  Will page Night MD.

## 2020-12-31 NOTE — Progress Notes (Signed)
PROGRESS NOTE    Sydney Contreras  YTK:[id number] DOB: 1990/12/26 DOA: 12/30/2020 PCP: Pcp, No     Brief Narrative:  Sydney Contreras is a 30 y.o. female with no significant past medical history presents to the ER with complaints of persistent left-sided blurred vision.  Patient symptoms started on December 26, 2020 about 5 days ago with left blurred vision.  Which persisted and patient went to the urgent care was given an eye ointment.  Patient had come to the ER yesterday had MRI brain and orbits done but had to leave to take care of her kid and presented back today.  Denies any numbness or weakness of the extremities.  Denies any difficulty swallowing or speaking.  No problem with the right eye. MRI brain with and without contrast and MRI orbit on the left side was done with and without contrast.  Shows features concerning for acute on chronic demyelination with 4 active demanding lesions and features also concerning for left-sided optic neuritis.  On-call neurologist was consulted patient was started on IV Solu-Medrol admitted for further observation.  New events last 24 hours / Subjective: Pt extremely rude and hostile toward me today. States she wants to leave the building and get real vit D from the sun and that [place] keeping her inside the hospital building is providing poor quality of care. Questions me regarding my credentials, despite me introducing myself multiple times, she says "interesting" when explaining my role in the hospital and her care. Asks me how much she needs to walk in order to prevent blood clots because she will refuse lovenox during her hospitalization. I advised her that she may speak with patient satisfaction staff with her concerns, which she asks who is covering and I did not know that answer to who is on call this weekend being Sunday, and she is upset that no one seems to know anything. RN reports similar experience with her being rude to staff.   Assessment & Plan:    Principal Problem:   Optic neuritis   Left optic neuritis, concern for MS -MRI brain: Acute on chronic demyelinating disease, most commonly indicating multiple sclerosis. There are at least 4 active demyelinating lesions. Abnormal contrast enhancement of the left optic nerve consistent with optic neuritis. -MRI cervical thoracic spine: Multiple foci of nonenhancing cord demyelination in the upper cervical region. No definite thoracic cord involvement. -Neurology following -IV solumedrol for 5 days with IV PPI -Pt refusing vit D supplementation      DVT prophylaxis: Pt refusing DVT ppx, wants to ambulate on her own    Code Status:     Code Status Orders  (From admission, onward)           Start     Ordered   12/30/20 2207  Full code  Continuous        07 /23/22 2206           Code Status History     This patient has a current code status but no historical code status.      Family Communication: None at bedside  Disposition Plan:  Status is: Observation  The patient will require care spanning > 2 midnights and should be moved to inpatient because: IV treatments appropriate due to intensity of illness or inability to take PO  Dispo: The patient is from: Home              Anticipated d/c is to: Home  Patient currently is not medically stable to d/c.   Difficult to place patient No      Consultants:  Neurology  Procedures:  None   Antimicrobials:  Anti-infectives (From admission, onward)    None        Objective: Vitals:   12/30/20 1931 12/30/20 2314 12/31/20 0510 12/31/20 0732  BP: 124/73 (!) 135/94 130/72 128/73  Pulse: 72 76 (!) 47 72  Resp: 16 18 18 16   Temp: 98.6 F (37 C) 98.8 F (37.1 C) 97.9 F (36.6 C) 97.7 F (36.5 C)  TempSrc: Oral Oral Oral Oral  SpO2: 100% 100% 99% 100%  Weight:  112.2 kg    Height:  5\' 7"  (1.702 m)      Intake/Output Summary (Last 24 hours) at 12/31/2020 0946 Last data filed at 12/31/2020  0746 Gross per 24 hour  Intake 296.12 ml  Output --  Net 296.12 ml   Filed Weights   12/30/20 2314  Weight: 112.2 kg    Examination:  General exam: Appears comfortable  Respiratory system: Respiratory effort normal. No respiratory distress. No conversational dyspnea.  Central nervous system: Alert and oriented. Speech clear.  Extremities: Symmetric in appearance  Psychiatry: Judgement and insight appear poor. Mood & affect inappropriate.   Did not do full neuro exam due to patient hostility.   Data Reviewed: I have personally reviewed following labs and imaging studies  CBC: Recent Labs  Lab 12/30/20 2017 12/31/20 0232  WBC 13.6* 14.3*  NEUTROABS 8.5*  --   HGB 13.6 13.2  HCT 41.8 41.3  MCV 85.8 85.3  PLT 325 330   Basic Metabolic Panel: Recent Labs  Lab 12/29/20 1726 12/31/20 0232  NA 137 135  K 3.7 4.1  CL 105 105  CO2 25 22  GLUCOSE 115* 136*  BUN 16 10  CREATININE 0.96 0.87  CALCIUM 9.2 9.2   GFR: Estimated Creatinine Clearance: 124.3 mL/min (by C-G formula based on SCr of 0.87 mg/dL). Liver Function Tests: No results for input(s): AST, ALT, ALKPHOS, BILITOT, PROT, ALBUMIN in the last 168 hours. No results for input(s): LIPASE, AMYLASE in the last 168 hours. No results for input(s): AMMONIA in the last 168 hours. Coagulation Profile: No results for input(s): INR, PROTIME in the last 168 hours. Cardiac Enzymes: No results for input(s): CKTOTAL, CKMB, CKMBINDEX, TROPONINI in the last 168 hours. BNP (last 3 results) No results for input(s): PROBNP in the last 8760 hours. HbA1C: No results for input(s): HGBA1C in the last 72 hours. CBG: Recent Labs  Lab 12/30/20 2320 12/31/20 0630  GLUCAP 115* 192*   Lipid Profile: No results for input(s): CHOL, HDL, LDLCALC, TRIG, CHOLHDL, LDLDIRECT in the last 72 hours. Thyroid Function Tests: No results for input(s): TSH, T4TOTAL, FREET4, T3FREE, THYROIDAB in the last 72 hours. Anemia Panel: No results for  input(s): VITAMINB12, FOLATE, FERRITIN, TIBC, IRON, RETICCTPCT in the last 72 hours. Sepsis Labs: No results for input(s): PROCALCITON, LATICACIDVEN in the last 168 hours.  Recent Results (from the past 240 hour(s))  Resp Panel by RT-PCR (Flu A&B, Covid) Nasopharyngeal Swab     Status: None   Collection Time: 12/30/20  9:01 PM   Specimen: Nasopharyngeal Swab; Nasopharyngeal(NP) swabs in vial transport medium  Result Value Ref Range Status   SARS Coronavirus 2 by RT PCR NEGATIVE NEGATIVE Final    Comment: (NOTE) SARS-CoV-2 target nucleic acids are NOT DETECTED.  The SARS-CoV-2 RNA is generally detectable in upper respiratory specimens during the acute phase of infection. The  lowest concentration of SARS-CoV-2 viral copies this assay can detect is 138 copies/mL. A negative result does not preclude SARS-Cov-2 infection and should not be used as the sole basis for treatment or other patient management decisions. A negative result may occur with  improper specimen collection/handling, submission of specimen other than nasopharyngeal swab, presence of viral mutation(s) within the areas targeted by this assay, and inadequate number of viral copies(<138 copies/mL). A negative result must be combined with clinical observations, patient history, and epidemiological information. The expected result is Negative.  Fact Sheet for Patients:  BloggerCourse.com  Fact Sheet for Healthcare Providers:  SeriousBroker.it  This test is no t yet approved or cleared by the [place] FDA and  has been authorized for detection and/or diagnosis of SARS-CoV-2 by FDA under an Emergency Use Authorization (EUA). This EUA will remain  in effect (meaning this test can be used) for the duration of the COVID-19 declaration under Section 564(b)(1) of the Act, 21 U.S.C.section 360bbb-3(b)(1), unless the authorization is terminated  or revoked sooner.        Influenza A by PCR NEGATIVE NEGATIVE Final   Influenza B by PCR NEGATIVE NEGATIVE Final    Comment: (NOTE) The Xpert Xpress SARS-CoV-2/FLU/RSV plus assay is intended as an aid in the diagnosis of influenza from Nasopharyngeal swab specimens and should not be used as a sole basis for treatment. Nasal washings and aspirates are unacceptable for Xpert Xpress SARS-CoV-2/FLU/RSV testing.  Fact Sheet for Patients: BloggerCourse.com  Fact Sheet for Healthcare Providers: SeriousBroker.it  This test is not yet approved or cleared by the [place] FDA and has been authorized for detection and/or diagnosis of SARS-CoV-2 by FDA under an Emergency Use Authorization (EUA). This EUA will remain in effect (meaning this test can be used) for the duration of the COVID-19 declaration under Section 564(b)(1) of the Act, 21 U.S.C. section 360bbb-3(b)(1), unless the authorization is terminated or revoked.  Performed at [facility], [address] [place], [place] [address]       Radiology Studies: MR Brain W and Wo Contrast  Result Date: 12/29/2020 CLINICAL DATA:  Left eye vision loss EXAM: MRI HEAD AND ORBITS WITHOUT AND WITH CONTRAST TECHNIQUE: Multiplanar, multiecho pulse sequences of the brain and surrounding structures were obtained without and with intravenous contrast. Multiplanar, multiecho pulse sequences of the orbits and surrounding structures were obtained including fat saturation techniques, before and after intravenous contrast administration. CONTRAST:  35mL GADAVIST GADOBUTROL 1 MMOL/ML IV SOLN COMPARISON:  None. FINDINGS: MRI HEAD FINDINGS Brain: No acute infarct, mass effect or extra-axial collection. No acute or chronic hemorrhage. There are multiple pericallosal and periventricular white matter lesions within both hemispheres, left-greater-than-right. There are approximately 10-20 lesions. There is mild diffusion restriction  associated with multiple lesions there are contrast enhancing lesions in the superior left parietal lobe, anterior left frontal lobe and at the left parietal temporal junction. No enhancing lesions in the right hemisphere. The midline structures are normal. Vascular: Major flow voids are preserved. Skull and upper cervical spine: Normal calvarium and skull base. Visualized upper cervical spine and soft tissues are normal. MRI ORBITS FINDINGS Orbits: --Globes: Normal. --Bony orbit: Normal. --Preseptal soft tissues: Normal. --Intra- and extraconal orbital fat: Normal. No inflammatory stranding. --Optic nerves: There is abnormal contrast enhancement of the left optic nerve within the left orbit. The right optic nerve is normal. --Lacrimal glands and fossae: Normal. --Extraocular muscles: Normal. Visualized sinuses:  No fluid levels or advanced mucosal thickening. Soft tissues: Normal. IMPRESSION:  1. Acute on chronic demyelinating disease, most commonly indicating multiple sclerosis. There are at least 4 active demyelinating lesions. 2. Abnormal contrast enhancement of the left optic nerve consistent with optic neuritis. Electronically Signed   By: Deatra RobinsonKevin  Herman M.D.   On: 12/29/2020 21:36   MR CERVICAL SPINE W WO CONTRAST  Result Date: 12/31/2020 CLINICAL DATA:  Baseline scanning for multiple sclerosis. Provided history of intracranial hypotension but not noted in the chart. EXAM: MRI CERVICAL AND THORACIC SPINE WITHOUT AND WITH CONTRAST TECHNIQUE: Multiplanar and multiecho pulse sequences of the cervical spine, to include the craniocervical junction and cervicothoracic junction, and the thoracic spine, were obtained without and with intravenous contrast. CONTRAST:  10mL GADAVIST GADOBUTROL 1 MMOL/ML IV SOLN COMPARISON:  None. FINDINGS: MRI CERVICAL SPINE FINDINGS Alignment: Physiologic Vertebrae: No fracture, evidence of discitis, or bone lesion. Cord: Short segment cord signal abnormalities which are nonenhancing  but have a somewhat rounded/slightly swollen appearance: 1. Central to posterior cord at C2 2. Central the posterior cord at C3 3. Posterior cord at C4 4. Central to right cord at C5 Posterior Fossa, vertebral arteries, paraspinal tissues: Negative Disc levels: No degenerative changes or impingement. MRI THORACIC SPINE FINDINGS Alignment:  Physiologic. Vertebrae: No fracture, evidence of discitis, or bone lesion. Cord: None convincing small ventral T2 hyperintensity at the level of T9-10 on sagittal STIR imaging. No abnormal enhancement, cord swelling, or cord thinning. Paraspinal and other soft tissues: Negative Disc levels: No herniation or impingement. IMPRESSION: 1. Multiple foci of nonenhancing cord demyelination in the upper cervical region. 2. No definite thoracic cord involvement. Electronically Signed   By: Marnee SpringJonathon  Watts M.D.   On: 12/31/2020 05:11   MR THORACIC SPINE W WO CONTRAST  Result Date: 12/31/2020 CLINICAL DATA:  Baseline scanning for multiple sclerosis. Provided history of intracranial hypotension but not noted in the chart. EXAM: MRI CERVICAL AND THORACIC SPINE WITHOUT AND WITH CONTRAST TECHNIQUE: Multiplanar and multiecho pulse sequences of the cervical spine, to include the craniocervical junction and cervicothoracic junction, and the thoracic spine, were obtained without and with intravenous contrast. CONTRAST:  10mL GADAVIST GADOBUTROL 1 MMOL/ML IV SOLN COMPARISON:  None. FINDINGS: MRI CERVICAL SPINE FINDINGS Alignment: Physiologic Vertebrae: No fracture, evidence of discitis, or bone lesion. Cord: Short segment cord signal abnormalities which are nonenhancing but have a somewhat rounded/slightly swollen appearance: 1. Central to posterior cord at C2 2. Central the posterior cord at C3 3. Posterior cord at C4 4. Central to right cord at C5 Posterior Fossa, vertebral arteries, paraspinal tissues: Negative Disc levels: No degenerative changes or impingement. MRI THORACIC SPINE FINDINGS  Alignment:  Physiologic. Vertebrae: No fracture, evidence of discitis, or bone lesion. Cord: None convincing small ventral T2 hyperintensity at the level of T9-10 on sagittal STIR imaging. No abnormal enhancement, cord swelling, or cord thinning. Paraspinal and other soft tissues: Negative Disc levels: No herniation or impingement. IMPRESSION: 1. Multiple foci of nonenhancing cord demyelination in the upper cervical region. 2. No definite thoracic cord involvement. Electronically Signed   By: Marnee SpringJonathon  Watts M.D.   On: 12/31/2020 05:11   MR ORBITS W WO CONTRAST  Result Date: 12/29/2020 CLINICAL DATA:  Left eye vision loss EXAM: MRI HEAD AND ORBITS WITHOUT AND WITH CONTRAST TECHNIQUE: Multiplanar, multiecho pulse sequences of the brain and surrounding structures were obtained without and with intravenous contrast. Multiplanar, multiecho pulse sequences of the orbits and surrounding structures were obtained including fat saturation techniques, before and after intravenous contrast administration. CONTRAST:  10mL GADAVIST GADOBUTROL 1 MMOL/ML  IV SOLN COMPARISON:  None. FINDINGS: MRI HEAD FINDINGS Brain: No acute infarct, mass effect or extra-axial collection. No acute or chronic hemorrhage. There are multiple pericallosal and periventricular white matter lesions within both hemispheres, left-greater-than-right. There are approximately 10-20 lesions. There is mild diffusion restriction associated with multiple lesions there are contrast enhancing lesions in the superior left parietal lobe, anterior left frontal lobe and at the left parietal temporal junction. No enhancing lesions in the right hemisphere. The midline structures are normal. Vascular: Major flow voids are preserved. Skull and upper cervical spine: Normal calvarium and skull base. Visualized upper cervical spine and soft tissues are normal. MRI ORBITS FINDINGS Orbits: --Globes: Normal. --Bony orbit: Normal. --Preseptal soft tissues: Normal. --Intra- and  extraconal orbital fat: Normal. No inflammatory stranding. --Optic nerves: There is abnormal contrast enhancement of the left optic nerve within the left orbit. The right optic nerve is normal. --Lacrimal glands and fossae: Normal. --Extraocular muscles: Normal. Visualized sinuses:  No fluid levels or advanced mucosal thickening. Soft tissues: Normal. IMPRESSION: 1. Acute on chronic demyelinating disease, most commonly indicating multiple sclerosis. There are at least 4 active demyelinating lesions. 2. Abnormal contrast enhancement of the left optic nerve consistent with optic neuritis. Electronically Signed   By: Deatra Robinson M.D.   On: 12/29/2020 21:36      Scheduled Meds:  insulin aspart  0-9 Units Subcutaneous TID WC   pantoprazole (PROTONIX) IV  40 mg Intravenous Q24H   Continuous Infusions:  methylPREDNISolone (SOLU-MEDROL) injection Stopped (12/31/20 0000)     LOS: 0 days      Time spent: 25 minutes   Noralee Stain, DO [facility] 12/31/2020, 9:46 AM   Available via Epic secure chat 7am-7pm After these hours, please refer to coverage provider listed on [email or web address]

## 2020-12-31 NOTE — Plan of Care (Signed)

## 2020-12-31 NOTE — Progress Notes (Signed)
When asked if patient Malika Goehring, was having any pain, she explained that her chest was hurting intermittently. She stated "I can't really place it. It hurts sometimes." I did ask when does it normally occur and asked if she felt it with activity or when she was having some anxiety/in a panic. She stated "yeah, but I can't really place when it happens.Marland Kitchenstress? Yeah."   Patient explained she has been feeling very overwhelmed with the process and did shed tears. She expressed the following:  - "I am trying to figure out what is wrong with me and all this happening at once." - "At this is new to me, the insulin, the steroids, not being allowed to do things." - "I don't usually deal with modern medicine."  I explained that I will have to discuss with the doctor about her chest pain and explain protocol with a possible EKG and troponin level, which requires a needle lab draw. Patient responded "I don't want to be stuck with anymore needles. It's okay."  Patient did respond well to breathing exercises, effective communication, walking about hallway, and relaxation. Patient is not having any other symptoms at this time, chest pain is resolved. When asked after, patient explains "It seemed like it could have been anxiety. I'm sure that was it, I feel better now."   I will make night MD aware via Amion app.

## 2020-12-31 NOTE — Progress Notes (Signed)
Neurology Progress Note  Patient ID: 30 yo woman with hx tobacco use p/w painful L vision loss and found to have imaging consistent with multiple sclerosis (L optic nerve enhancement, multiple other enhancing and nonenhancing parenchymal lesions).  Subjective: - Started on solumedrol last night - No change in vision today - No new neurologic complaints  Interval data: MRI c spine wwo: multiple foci of nonenhancing cord demyelination MRI t spine wwo: normal  CNS imaging personally reviewed  Exam: Vitals:   12/31/20 1523 12/31/20 2026  BP: 127/90 (!) 146/72  Pulse: (!) 59 65  Resp: 16 18  Temp: 98.2 F (36.8 C) 98.3 F (36.8 C)  SpO2: 100% 100%    Physical Exam Gen: A&Ox4, NAD HEENT: Atraumatic, normocephalic; oropharynx clear, tongue without atrophy or fasciculations. Resp: CTAB, normal work of breathing CV: RRR, extremities appear well-perfused. Abd: soft/NT/ND Extrem: Nml bulk; no cyanosis, clubbing, or edema.  Neuro: *MS: A&O x4. Follows multi-step commands.  *Speech: no dysarthria or aphasia, able to name and repeat. *CN:    I: Deferred   II,III: PERRLA, L APD, VFF by confrontation R eye, unable to differentiate between 1 and 2 fingers in any quadrant on L, optic discs not visualized 2/2 pupillary constriction   III,IV,VI: EOMI w/o nystagmus, no ptosis   V: Sensation intact from V1 to V3 to LT   VII: Eyelid closure was full.  Smile symmetric.   VIII: Hearing intact to voice   IX,X: Voice normal, palate elevates symmetrically    XI: SCM/trap 5/5 bilat   XII: Tongue protrudes midline, no atrophy or fasciculations  *Motor:   Normal bulk.  No tremor, rigidity or bradykinesia. No pronator drift.   Strength: Dlt Bic Tri WE WrF FgS Gr HF KnF KnE PlF DoF    Left 5 5 5 5 5 5 5 5 5 5 5 5     Right 5 5 5 5 5 5 5 5 5 5 5 5    *Sensory: Intact to light touch, pinprick, temperature vibration throughout. Symmetric. Propioception intact bilat.  No double-simultaneous  extinction.  *Coordination:  Finger-to-nose, heel-to-shin, rapid alternating motions were intact. *Reflexes:  2+ and symmetric throughout without clonus; toes down-going bilat *Gait: deferred  Impression: 30 yo woman with hx tobacco use p/w painful L vision loss and found to have imaging consistent with multiple sclerosis (L optic nerve enhancement, multiple other enhancing and nonenhancing parenchymal lesions).  Recommendations: - IV solumedrol 1000mg  Q24 hours x 5 doses (end date 7/27) - PPI and Calcium + Vit D while on steroids. - Follow up with neurology outpatient with Dr. 26 with [facility]. - Serum AQP4 Ab + MoG Ab.  Will continue to follow.  8/27, MD [facility] [phone]  If 7pm- 7am, please page neurology on call as listed in AMION.

## 2021-01-01 DIAGNOSIS — H469 Unspecified optic neuritis: Secondary | ICD-10-CM | POA: Diagnosis not present

## 2021-01-01 DIAGNOSIS — G35 Multiple sclerosis: Secondary | ICD-10-CM

## 2021-01-01 LAB — GLUCOSE, CAPILLARY
Glucose-Capillary: 222 mg/dL — ABNORMAL HIGH (ref 70–99)
Glucose-Capillary: 228 mg/dL — ABNORMAL HIGH (ref 70–99)
Glucose-Capillary: 191 mg/dL — ABNORMAL HIGH (ref 70–99)

## 2021-01-01 LAB — NEUROMYELITIS OPTICA AUTOAB, IGG: NMO-IgG: <1.5 U/mL (ref 0.0–3.0)

## 2021-01-01 LAB — TROPONIN I (HIGH SENSITIVITY): Troponin I (High Sensitivity): 4 ng/L (ref <18)

## 2021-01-01 MED ORDER — CALCIUM CARBONATE-VITAMIN D 500-200 MG-UNIT PO TABS
1.0000 | ORAL_TABLET | Freq: Every day | ORAL | Status: DC
  Administered 2021-01-01 – 2021-01-03 (×3): 1 via ORAL
  Filled 2021-01-01 (×3): qty 1

## 2021-01-01 MED ORDER — POLYETHYLENE GLYCOL 3350 17 G PO PACK: 17.0000 g | PACK | Freq: Every day | ORAL | Status: DC | PRN

## 2021-01-01 NOTE — Progress Notes (Signed)
PROGRESS NOTE    Sydney Contreras  NTZ:[id number] DOB: 1991-03-10 DOA: 12/30/2020 PCP: Pcp, No   Brief Narrative:   Sydney Contreras is a 30 y.o. female with no significant past medical history presents to the ER with complaints of persistent left-sided blurred vision.  Patient symptoms started on December 26, 2020 about 5 days ago with left blurred vision.  Which persisted and patient went to the urgent care was given an eye ointment.  Patient had come to the ER yesterday had MRI brain and orbits done but had to leave to take care of her kid and presented back today.  Denies any numbness or weakness of the extremities.  Denies any difficulty swallowing or speaking.  No problem with the right eye. MRI brain with and without contrast and MRI orbit on the left side was done with and without contrast.  Shows features concerning for acute on chronic demyelination with 4 active demanding lesions and features also concerning for left-sided optic neuritis.  On-call neurologist was consulted patient was started on IV Solu-Medrol admitted for further observation.  -Patient continues to remain on IV steroids and has been refusing insulin dosing, but now understands the need to take this medication and is more agreeable.  She states that her vision in her left eye is slowly improving.  Assessment & Plan:   Principal Problem:   Optic neuritis Active Problems:   Multiple sclerosis (HCC)   Left optic neuritis, concern for MS -MRI brain: Acute on chronic demyelinating disease, most commonly indicating multiple sclerosis. There are at least 4 active demyelinating lesions. Abnormal contrast enhancement of the left optic nerve consistent with optic neuritis. -MRI cervical thoracic spine: Multiple foci of nonenhancing cord demyelination in the upper cervical region. No definite thoracic cord involvement. -Neurology following -IV solumedrol for 5 days with IV PPI, currently day 3 -Started calcium and vitamin D  supplementation  Mild constipation -MiraLAX ordered as needed  Family history of type 2 diabetes -Hemoglobin A1c screening  -Noted to have some mild to moderate hyperglycemia with steroid use -Currently on SSI for coverage   DVT prophylaxis: Patient refusing and is ambulatory Code Status: Full Family Communication: None at bedside, patient will call Disposition Plan:  Status is: Inpatient  Remains inpatient appropriate because:IV treatments appropriate due to intensity of illness or inability to take PO  Dispo: The patient is from: Home              Anticipated d/c is to: Home              Patient currently is not medically stable to d/c.   Difficult to place patient No   Consultants:  Neurology  Procedures:  See below  Antimicrobials:  None   Subjective: Patient seen and evaluated today with no new acute complaints or concerns. No acute concerns or events noted overnight.  She states that her vision in her left eye is slowly improving.  She complains of not having had bowel movements in the last couple days and would like something to assist with this.  She is also requesting that she have her hemoglobin A1c checked prior to agreeing to take any insulin for sliding scale coverage, but after further discussion is now agreeable to taking her insulin.  Objective: Vitals:   12/31/20 2026 12/31/20 2358 01/01/21 0420 01/01/21 0833  BP: (!) 146/72 (!) 152/87 108/61 138/79  Pulse: 65 79 73 68  Resp: 18 16 16 14   Temp: 98.3 F (36.8 C) 97.8 F (36.6  C) 97.8 F (36.6 C) 98.3 F (36.8 C)  TempSrc: Oral Oral Oral Oral  SpO2: 100% 99% 98% 99%  Weight:      Height:        Intake/Output Summary (Last 24 hours) at 01/01/2021 0858 Last data filed at 12/31/2020 0900 Gross per 24 hour  Intake 240 ml  Output --  Net 240 ml   Filed Weights   12/30/20 2314  Weight: 112.2 kg    Examination:  General exam: Appears calm and comfortable, obese Respiratory system: Clear to  auscultation. Respiratory effort normal. Cardiovascular system: S1 & S2 heard, RRR.  Gastrointestinal system: Abdomen is soft Central nervous system: Alert and awake Extremities: No edema Skin: No significant lesions noted Psychiatry: Flat affect.    Data Reviewed: I have personally reviewed following labs and imaging studies  CBC: Recent Labs  Lab 12/30/20 2017 12/31/20 0232  WBC 13.6* 14.3*  NEUTROABS 8.5*  --   HGB 13.6 13.2  HCT 41.8 41.3  MCV 85.8 85.3  PLT 325 330   Basic Metabolic Panel: Recent Labs  Lab 12/29/20 1726 12/31/20 0232  NA 137 135  K 3.7 4.1  CL 105 105  CO2 25 22  GLUCOSE 115* 136*  BUN 16 10  CREATININE 0.96 0.87  CALCIUM 9.2 9.2   GFR: Estimated Creatinine Clearance: 124.3 mL/min (by C-G formula based on SCr of 0.87 mg/dL). Liver Function Tests: No results for input(s): AST, ALT, ALKPHOS, BILITOT, PROT, ALBUMIN in the last 168 hours. No results for input(s): LIPASE, AMYLASE in the last 168 hours. No results for input(s): AMMONIA in the last 168 hours. Coagulation Profile: No results for input(s): INR, PROTIME in the last 168 hours. Cardiac Enzymes: No results for input(s): CKTOTAL, CKMB, CKMBINDEX, TROPONINI in the last 168 hours. BNP (last 3 results) No results for input(s): PROBNP in the last 8760 hours. HbA1C: No results for input(s): HGBA1C in the last 72 hours. CBG: Recent Labs  Lab 12/31/20 0630 12/31/20 1139 12/31/20 1523 12/31/20 2025 01/01/21 0617  GLUCAP 192* 249* 178* 199* 222*   Lipid Profile: No results for input(s): CHOL, HDL, LDLCALC, TRIG, CHOLHDL, LDLDIRECT in the last 72 hours. Thyroid Function Tests: No results for input(s): TSH, T4TOTAL, FREET4, T3FREE, THYROIDAB in the last 72 hours. Anemia Panel: No results for input(s): VITAMINB12, FOLATE, FERRITIN, TIBC, IRON, RETICCTPCT in the last 72 hours. Sepsis Labs: No results for input(s): PROCALCITON, LATICACIDVEN in the last 168 hours.  Recent Results (from  the past 240 hour(s))  Resp Panel by RT-PCR (Flu A&B, Covid) Nasopharyngeal Swab     Status: None   Collection Time: 12/30/20  9:01 PM   Specimen: Nasopharyngeal Swab; Nasopharyngeal(NP) swabs in vial transport medium  Result Value Ref Range Status   SARS Coronavirus 2 by RT PCR NEGATIVE NEGATIVE Final    Comment: (NOTE) SARS-CoV-2 target nucleic acids are NOT DETECTED.  The SARS-CoV-2 RNA is generally detectable in upper respiratory specimens during the acute phase of infection. The lowest concentration of SARS-CoV-2 viral copies this assay can detect is 138 copies/mL. A negative result does not preclude SARS-Cov-2 infection and should not be used as the sole basis for treatment or other patient management decisions. A negative result may occur with  improper specimen collection/handling, submission of specimen other than nasopharyngeal swab, presence of viral mutation(s) within the areas targeted by this assay, and inadequate number of viral copies(<138 copies/mL). A negative result must be combined with clinical observations, patient history, and epidemiological information. The expected result  is Negative.  Fact Sheet for Patients:  BloggerCourse.com  Fact Sheet for Healthcare Providers:  SeriousBroker.it  This test is no t yet approved or cleared by the [place] FDA and  has been authorized for detection and/or diagnosis of SARS-CoV-2 by FDA under an Emergency Use Authorization (EUA). This EUA will remain  in effect (meaning this test can be used) for the duration of the COVID-19 declaration under Section 564(b)(1) of the Act, 21 U.S.C.section 360bbb-3(b)(1), unless the authorization is terminated  or revoked sooner.       Influenza A by PCR NEGATIVE NEGATIVE Final   Influenza B by PCR NEGATIVE NEGATIVE Final    Comment: (NOTE) The Xpert Xpress SARS-CoV-2/FLU/RSV plus assay is intended as an aid in the diagnosis of  influenza from Nasopharyngeal swab specimens and should not be used as a sole basis for treatment. Nasal washings and aspirates are unacceptable for Xpert Xpress SARS-CoV-2/FLU/RSV testing.  Fact Sheet for Patients: BloggerCourse.com  Fact Sheet for Healthcare Providers: SeriousBroker.it  This test is not yet approved or cleared by the [place] FDA and has been authorized for detection and/or diagnosis of SARS-CoV-2 by FDA under an Emergency Use Authorization (EUA). This EUA will remain in effect (meaning this test can be used) for the duration of the COVID-19 declaration under Section 564(b)(1) of the Act, 21 U.S.C. section 360bbb-3(b)(1), unless the authorization is terminated or revoked.  Performed at [facility], [address] [place], [place] [address]          Radiology Studies: MR CERVICAL SPINE W WO CONTRAST  Result Date: 12/31/2020 CLINICAL DATA:  Baseline scanning for multiple sclerosis. Provided history of intracranial hypotension but not noted in the chart. EXAM: MRI CERVICAL AND THORACIC SPINE WITHOUT AND WITH CONTRAST TECHNIQUE: Multiplanar and multiecho pulse sequences of the cervical spine, to include the craniocervical junction and cervicothoracic junction, and the thoracic spine, were obtained without and with intravenous contrast. CONTRAST:  14mL GADAVIST GADOBUTROL 1 MMOL/ML IV SOLN COMPARISON:  None. FINDINGS: MRI CERVICAL SPINE FINDINGS Alignment: Physiologic Vertebrae: No fracture, evidence of discitis, or bone lesion. Cord: Short segment cord signal abnormalities which are nonenhancing but have a somewhat rounded/slightly swollen appearance: 1. Central to posterior cord at C2 2. Central the posterior cord at C3 3. Posterior cord at C4 4. Central to right cord at C5 Posterior Fossa, vertebral arteries, paraspinal tissues: Negative Disc levels: No degenerative changes or impingement. MRI THORACIC SPINE  FINDINGS Alignment:  Physiologic. Vertebrae: No fracture, evidence of discitis, or bone lesion. Cord: None convincing small ventral T2 hyperintensity at the level of T9-10 on sagittal STIR imaging. No abnormal enhancement, cord swelling, or cord thinning. Paraspinal and other soft tissues: Negative Disc levels: No herniation or impingement. IMPRESSION: 1. Multiple foci of nonenhancing cord demyelination in the upper cervical region. 2. No definite thoracic cord involvement. Electronically Signed   By: Marnee Spring M.D.   On: 12/31/2020 05:11   MR THORACIC SPINE W WO CONTRAST  Result Date: 12/31/2020 CLINICAL DATA:  Baseline scanning for multiple sclerosis. Provided history of intracranial hypotension but not noted in the chart. EXAM: MRI CERVICAL AND THORACIC SPINE WITHOUT AND WITH CONTRAST TECHNIQUE: Multiplanar and multiecho pulse sequences of the cervical spine, to include the craniocervical junction and cervicothoracic junction, and the thoracic spine, were obtained without and with intravenous contrast. CONTRAST:  67mL GADAVIST GADOBUTROL 1 MMOL/ML IV SOLN COMPARISON:  None. FINDINGS: MRI CERVICAL SPINE FINDINGS Alignment: Physiologic Vertebrae: No fracture, evidence of discitis, or bone  lesion. Cord: Short segment cord signal abnormalities which are nonenhancing but have a somewhat rounded/slightly swollen appearance: 1. Central to posterior cord at C2 2. Central the posterior cord at C3 3. Posterior cord at C4 4. Central to right cord at C5 Posterior Fossa, vertebral arteries, paraspinal tissues: Negative Disc levels: No degenerative changes or impingement. MRI THORACIC SPINE FINDINGS Alignment:  Physiologic. Vertebrae: No fracture, evidence of discitis, or bone lesion. Cord: None convincing small ventral T2 hyperintensity at the level of T9-10 on sagittal STIR imaging. No abnormal enhancement, cord swelling, or cord thinning. Paraspinal and other soft tissues: Negative Disc levels: No herniation or  impingement. IMPRESSION: 1. Multiple foci of nonenhancing cord demyelination in the upper cervical region. 2. No definite thoracic cord involvement. Electronically Signed   By: Marnee Spring M.D.   On: 12/31/2020 05:11        Scheduled Meds:  calcium-vitamin D  1 tablet Oral Q breakfast   insulin aspart  0-9 Units Subcutaneous TID WC   pantoprazole (PROTONIX) IV  40 mg Intravenous Q24H   Continuous Infusions:  methylPREDNISolone (SOLU-MEDROL) injection 1,000 mg (12/31/20 2257)     LOS: 1 day    Time spent: 35 minutes    Sydney Alwin Hoover Brunette, DO [facility]  If 7PM-7AM, please contact night-coverage [email or web address] 01/01/2021, 8:58 AM

## 2021-01-01 NOTE — Progress Notes (Signed)
EKG done at 0100.

## 2021-01-02 LAB — CBC
HCT: 38.4 % (ref 36.0–46.0)
Hemoglobin: 12.8 g/dL (ref 12.0–15.0)
MCH: 27.6 pg (ref 26.0–34.0)
MCHC: 33.3 g/dL (ref 30.0–36.0)
MCV: 82.8 fL (ref 80.0–100.0)
Platelets: 243 10*3/uL (ref 150–400)
RBC: 4.64 MIL/uL (ref 3.87–5.11)
RDW: 15.2 % (ref 11.5–15.5)
WBC: 23.3 10*3/uL — ABNORMAL HIGH (ref 4.0–10.5)
nRBC: 0 % (ref 0.0–0.2)

## 2021-01-02 LAB — HEMOGLOBIN A1C
Hgb A1c MFr Bld: 5.6 % (ref 4.8–5.6)
Mean Plasma Glucose: 114 mg/dL

## 2021-01-02 LAB — GLUCOSE, CAPILLARY
Glucose-Capillary: 198 mg/dL — ABNORMAL HIGH (ref 70–99)
Glucose-Capillary: 220 mg/dL — ABNORMAL HIGH (ref 70–99)
Glucose-Capillary: 222 mg/dL — ABNORMAL HIGH (ref 70–99)
Glucose-Capillary: 183 mg/dL — ABNORMAL HIGH (ref 70–99)

## 2021-01-02 MED ORDER — PANTOPRAZOLE SODIUM 40 MG IV SOLR
40.0000 mg | INTRAVENOUS | Status: DC
  Administered 2021-01-02: 40 mg via INTRAVENOUS
  Filled 2021-01-02 (×2): qty 40

## 2021-01-02 MED ORDER — SODIUM CHLORIDE 0.9 % IV SOLN
1000.0000 mg | INTRAVENOUS | Status: DC
  Administered 2021-01-02: 1000 mg via INTRAVENOUS
  Filled 2021-01-02 (×2): qty 8

## 2021-01-02 NOTE — Plan of Care (Signed)

## 2021-01-02 NOTE — Progress Notes (Signed)
Patient refused insulin at 1600. She also refused protonix scheduled for 1800. She states she  takes protonix with her steroid at night.

## 2021-01-02 NOTE — Progress Notes (Signed)
PROGRESS NOTE    Sydney Contreras  WIO:[id number] DOB: 05-15-1991 DOA: 12/30/2020 PCP: Pcp, No   Brief Narrative:   Sydney Contreras is a 30 y.o. female with no significant past medical history presents to the ER with complaints of persistent left-sided blurred vision.  Patient symptoms started on December 26, 2020 about 5 days ago with left blurred vision.  Which persisted and patient went to the urgent care was given an eye ointment.  Patient had come to the ER yesterday had MRI brain and orbits done but had to leave to take care of her kid and presented back today.    MRI brain with and without contrast and MRI orbit on the left side was done with and without contrast.  Shows features concerning for acute on chronic demyelination with 4 active demanding lesions and features also concerning for left-sided optic neuritis.  On-call neurologist was consulted patient was started on IV Solu-Medrol admitted for further observation.  Patient continues to remain on IV steroids and has been refusing insulin dosing.  Assessment & Plan:   Principal Problem:   Optic neuritis Active Problems:   Multiple sclerosis (HCC)   Left optic neuritis, concern for MS -MRI brain: Acute on chronic demyelinating disease, most commonly indicating multiple sclerosis. There are at least 4 active demyelinating lesions. Abnormal contrast enhancement of the left optic nerve consistent with optic neuritis. -MRI cervical thoracic spine: Multiple foci of nonenhancing cord demyelination in the upper cervical region. No definite thoracic cord involvement. -Neurology following -IV solumedrol for 5 days with IV PPI, currently day 4- stop date 7/27 -Started calcium and vitamin D supplementation  Mild constipation -MiraLAX ordered as needed  Family history of type 2 diabetes -Hemoglobin A1c screening  -Noted to have some mild to moderate hyperglycemia with steroid use -Currently on SSI for coverage- refusing so will change diet  to carb mod   DVT prophylaxis: Patient refusing and is ambulatory Code Status: Full Disposition Plan:  Status is: Inpatient  Remains inpatient appropriate because:IV treatments appropriate due to intensity of illness or inability to take PO  Dispo: The patient is from: Home              Anticipated d/c is to: Home              Patient currently is not medically stable to d/c.   Difficult to place patient No   Consultants:  Neurology    Subjective: Denies issues-- ring light at bedside  Objective: Vitals:   01/02/21 0022 01/02/21 0323 01/02/21 0834 01/02/21 1135  BP: 122/80 140/84 (!) 152/70 107/69  Pulse: (!) 50 (!) 46 60 (!) 56  Resp: 18 17 16 14   Temp: 97.8 F (36.6 C) 97.8 F (36.6 C) 97.9 F (36.6 C) 98.6 F (37 C)  TempSrc: Oral Oral Oral Oral  SpO2: 99% 98% 100% 100%  Weight:      Height:        Intake/Output Summary (Last 24 hours) at 01/02/2021 1252 Last data filed at 01/01/2021 1300 Gross per 24 hour  Intake 420 ml  Output --  Net 420 ml   Filed Weights   12/30/20 2314  Weight: 112.2 kg    Examination:   General: Appearance:    Obese female in no acute distress     Lungs:     respirations unlabored  Heart:    Bradycardic.   MS:   All extremities are intact.    Neurologic:   Flat affect  Data Reviewed: I have personally reviewed following labs and imaging studies  CBC: Recent Labs  Lab 12/30/20 2017 12/31/20 0232 01/02/21 0039  WBC 13.6* 14.3* 23.3*  NEUTROABS 8.5*  --   --   HGB 13.6 13.2 12.8  HCT 41.8 41.3 38.4  MCV 85.8 85.3 82.8  PLT 325 330 243   Basic Metabolic Panel: Recent Labs  Lab 12/29/20 1726 12/31/20 0232  NA 137 135  K 3.7 4.1  CL 105 105  CO2 25 22  GLUCOSE 115* 136*  BUN 16 10  CREATININE 0.96 0.87  CALCIUM 9.2 9.2   GFR: Estimated Creatinine Clearance: 124.3 mL/min (by C-G formula based on SCr of 0.87 mg/dL). Liver Function Tests: No results for input(s): AST, ALT, ALKPHOS, BILITOT, PROT,  ALBUMIN in the last 168 hours. No results for input(s): LIPASE, AMYLASE in the last 168 hours. No results for input(s): AMMONIA in the last 168 hours. Coagulation Profile: No results for input(s): INR, PROTIME in the last 168 hours. Cardiac Enzymes: No results for input(s): CKTOTAL, CKMB, CKMBINDEX, TROPONINI in the last 168 hours. BNP (last 3 results) No results for input(s): PROBNP in the last 8760 hours. HbA1C: Recent Labs    01/01/21 0232  HGBA1C 5.6   CBG: Recent Labs  Lab 01/01/21 0617 01/01/21 1140 01/01/21 1655 01/02/21 0643 01/02/21 1131  GLUCAP 222* 228* 191* 198* 220*   Lipid Profile: No results for input(s): CHOL, HDL, LDLCALC, TRIG, CHOLHDL, LDLDIRECT in the last 72 hours. Thyroid Function Tests: No results for input(s): TSH, T4TOTAL, FREET4, T3FREE, THYROIDAB in the last 72 hours. Anemia Panel: No results for input(s): VITAMINB12, FOLATE, FERRITIN, TIBC, IRON, RETICCTPCT in the last 72 hours. Sepsis Labs: No results for input(s): PROCALCITON, LATICACIDVEN in the last 168 hours.  Recent Results (from the past 240 hour(s))  Resp Panel by RT-PCR (Flu A&B, Covid) Nasopharyngeal Swab     Status: None   Collection Time: 12/30/20  9:01 PM   Specimen: Nasopharyngeal Swab; Nasopharyngeal(NP) swabs in vial transport medium  Result Value Ref Range Status   SARS Coronavirus 2 by RT PCR NEGATIVE NEGATIVE Final    Comment: (NOTE) SARS-CoV-2 target nucleic acids are NOT DETECTED.  The SARS-CoV-2 RNA is generally detectable in upper respiratory specimens during the acute phase of infection. The lowest concentration of SARS-CoV-2 viral copies this assay can detect is 138 copies/mL. A negative result does not preclude SARS-Cov-2 infection and should not be used as the sole basis for treatment or other patient management decisions. A negative result may occur with  improper specimen collection/handling, submission of specimen other than nasopharyngeal swab, presence of  viral mutation(s) within the areas targeted by this assay, and inadequate number of viral copies(<138 copies/mL). A negative result must be combined with clinical observations, patient history, and epidemiological information. The expected result is Negative.  Fact Sheet for Patients:  BloggerCourse.com  Fact Sheet for Healthcare Providers:  SeriousBroker.it  This test is no t yet approved or cleared by the [place] FDA and  has been authorized for detection and/or diagnosis of SARS-CoV-2 by FDA under an Emergency Use Authorization (EUA). This EUA will remain  in effect (meaning this test can be used) for the duration of the COVID-19 declaration under Section 564(b)(1) of the Act, 21 U.S.C.section 360bbb-3(b)(1), unless the authorization is terminated  or revoked sooner.       Influenza A by PCR NEGATIVE NEGATIVE Final   Influenza B by PCR NEGATIVE NEGATIVE Final    Comment: (NOTE) The Xpert  Xpress SARS-CoV-2/FLU/RSV plus assay is intended as an aid in the diagnosis of influenza from Nasopharyngeal swab specimens and should not be used as a sole basis for treatment. Nasal washings and aspirates are unacceptable for Xpert Xpress SARS-CoV-2/FLU/RSV testing.  Fact Sheet for Patients: BloggerCourse.com  Fact Sheet for Healthcare Providers: SeriousBroker.it  This test is not yet approved or cleared by the [place] FDA and has been authorized for detection and/or diagnosis of SARS-CoV-2 by FDA under an Emergency Use Authorization (EUA). This EUA will remain in effect (meaning this test can be used) for the duration of the COVID-19 declaration under Section 564(b)(1) of the Act, 21 U.S.C. section 360bbb-3(b)(1), unless the authorization is terminated or revoked.  Performed at [facility], [address], [place], [place] [address]          Radiology  Studies: No results found.      Scheduled Meds:  calcium-vitamin D  1 tablet Oral Q breakfast   insulin aspart  0-9 Units Subcutaneous TID WC   pantoprazole (PROTONIX) IV  40 mg Intravenous Q24H   Continuous Infusions:  methylPREDNISolone (SOLU-MEDROL) injection 1,000 mg (01/01/21 2313)     LOS: 2 days    Time spent: 25 minutes    Fee Art, DO [facility]  If 7PM-7AM, please contact night-coverage [email or web address] 01/02/2021, 12:52 PM

## 2021-01-02 NOTE — Progress Notes (Signed)
Patient refused her 1200 insulin. She states "I'm gonna pass, the insulin is making my blood sugar high anyway." Will revisit later.

## 2021-01-03 LAB — GLUCOSE, CAPILLARY
Glucose-Capillary: 199 mg/dL — ABNORMAL HIGH (ref 70–99)
Glucose-Capillary: 199 mg/dL — ABNORMAL HIGH (ref 70–99)

## 2021-01-03 MED ORDER — SODIUM CHLORIDE 0.9 % IV SOLN
1000.0000 mg | INTRAVENOUS | Status: AC
  Administered 2021-01-03: 1000 mg via INTRAVENOUS
  Filled 2021-01-03: qty 8

## 2021-01-03 MED ORDER — CALCIUM CARBONATE-VITAMIN D 500-200 MG-UNIT PO TABS: 1.0000 | ORAL_TABLET | Freq: Every day | ORAL | 0 refills | Status: AC

## 2021-01-03 MED ORDER — PANTOPRAZOLE SODIUM 40 MG IV SOLR
40.0000 mg | INTRAVENOUS | Status: AC
Start: 2021-01-03 — End: 2021-01-03
  Administered 2021-01-03: 40 mg via INTRAVENOUS
  Filled 2021-01-03: qty 40

## 2021-01-03 NOTE — Progress Notes (Signed)
Pt walked off unit with all her belongings

## 2021-01-03 NOTE — TOC Transition Note (Signed)
Transition of Care [facility]) - CM/SW Discharge Note   Patient Details  Name: Huntleigh Doolen MRN: [id number] Date of Birth: May 20, 1991  [facility] CM/SW Contact:  Kermit Balo, RN Phone Number: 01/03/2021, 11:07 AM   Clinical Narrative:    Pt discharging home with self care. Pt states she has a friend that will check in on her. She denies issues with transportation.  No PCP: CM was able to get her an appointment at [facility] with information on the AVS. Pt has transportation home today.   Final next level of care: Home/Self Care Barriers to Discharge: Inadequate or no insurance, Barriers Unresolved (comment)   Patient Goals and CMS Choice        Discharge Placement                       Discharge Plan and Services                                     Social Determinants of Health (SDOH) Interventions     Readmission Risk Interventions No flowsheet data found.

## 2021-01-03 NOTE — Progress Notes (Addendum)
Subjective:  Currently on day #5/5 of high dose steroid therapy. She reports significant improvement in both pain and vision involving her left eye. She notes that the pain resolved after the first steroid dose and vision began to improve after the 3rd steroid dose. She still does not feel that her vision has returned to normal but is elated with the improvement so far.    Objective: Current vital signs: BP 122/69 (BP Location: Left Arm)   Pulse (!) 43   Temp 97.7 F (36.5 C) (Oral)   Resp 16   Ht 5\' 7"  (1.702 m)   Wt 112.2 kg   SpO2 100%   BMI 38.74 kg/m  Vital signs in last 24 hours: Temp:  [97.7 F (36.5 C)-98.6 F (37 C)] 97.7 F (36.5 C) (07/27 0801) Pulse Rate:  [43-61] 43 (07/27 0801) Resp:  [14-16] 16 (07/27 0801) BP: (107-139)/(53-79) 122/69 (07/27 0801) SpO2:  [100 %] 100 % (07/27 0801)  Intake/Output from previous day: 07/26 0701 - 07/27 0700 In: 680 [P.O.:680] Out: -   General: well appearing obese female Eyes: no conjunctival injection or tearing.   Neurologic Exam: Mental status: a/o x4. Speech: normal pattern and articulation Cranial nerves: EOMs intact and does not elicit pain. Peripheral visual fields intact. She was able to read the board in her room with her left eye only from a distance of approximately 10 feet. Face is symmetric. Hearing is intact. Shoulder shrug strength equal bilaterally. Motor: 5/5 strength in the bilateral upper and lower extremities.  Sensation: intact in the the extremities bilaterally Gait: normal  Lab Results: BMP Latest Ref Rng & Units 12/31/2020 12/29/2020  Glucose 70 - 99 mg/dL 12/31/2020) 440(N)  BUN 6 - 20 mg/dL 10 16  Creatinine 027(O - 1.00 mg/dL 5.36 6.44  Sodium 0.34 - 145 mmol/L 135 137  Potassium 3.5 - 5.1 mmol/L 4.1 3.7  Chloride 98 - 111 mmol/L 105 105  CO2 22 - 32 mmol/L 22 25  Calcium 8.9 - 10.3 mg/dL 9.2 9.2   CBC Latest Ref Rng & Units 01/02/2021 12/31/2020 12/30/2020  WBC 4.0 - 10.5 K/uL 23.3(H) 14.3(H) 13.6(H)   Hemoglobin 12.0 - 15.0 g/dL 01/01/2021 59.5 63.8  Hematocrit 36.0 - 46.0 % 38.4 41.3 41.8  Platelets 150 - 400 K/uL 243 330 325   AQP4 negative  Studies/Results: MR orbits w + w/o: Abnormal contrast enhancement of the left optic nerve consistent with optic neuritis.   MRI Brain w + w/o: Acute on chronic demyelinating disease, most commonly indicating multiple sclerosis. There are at least 4 active demyelinating lesions.  MRI cervical and thoracic spine: Non-enhancing lesions at C2, C3, C4, C5 No definite thoracic cord involvement   Assessment:  30 year old female with no significant past medical history who presented for painful left vision loss and was subsequently found to have imaging consistent with multiple sclerosis (left optic nerve enhancement along with multiple other enhancing lesions involving the cervical spine and brain. -AQP4 negative.  -Symptoms have largely improved since admission after 4 days of high dose steroid treatment.    Recommendations -Now on day #5/5 of high dose steroid infusion -Will be stable, from a neurology perspective, for discharge after completion of the final steroid dose tonight -Will need to follow up with Dr. 26 at [facility] after discharge.     LOS: 3 days   [facility], MD Internal Medicine Resident PGY-3 [facility] Pager: [phone] 01/03/2021 9:24 AM     Electronically  signed: Dr. Kerney Elbe

## 2021-01-03 NOTE — Plan of Care (Signed)

## 2021-01-04 NOTE — Discharge Summary (Signed)
Physician Discharge Summary  Sydney Contreras FMB:[id number] DOB: [birth year] DOA: 12/30/2020  PCP: Pcp, No  Admit date: 12/30/2020 Discharge date: 01/03/2021  Admitted From: home Discharge disposition: home   Recommendations for Outpatient Follow-Up:   Referral to neurology Dr. Epimenio Foot.     Discharge Diagnosis:   Principal Problem:   Optic neuritis Active Problems:   Multiple sclerosis (HCC)    Discharge Condition: Improved.  Diet recommendation:   Regular.  Wound care: None.  Code status: Full.     Hospital Course by Problem:    Assessment:  30 year old female with no significant past medical history who presented for painful left vision loss and was subsequently found to have imaging consistent with multiple sclerosis (left optic nerve enhancement along with multiple other enhancing lesions involving the cervical spine and brain. -AQP4 negative.  -Symptoms have largely improved since admission after 4 days of high dose steroid treatment.  -Now on day #5/5 of high dose steroid infusion -Will need to follow up with Dr. Epimenio Foot at [facility] after discharge.     Medical Consultants:   neurology   Discharge Exam:   Vitals:   01/03/21 1210 01/03/21 1535  BP: 132/83 125/78  Pulse: (!) 45 (!) 52  Resp: 16 16  Temp: 97.8 F (36.6 C) 97.8 F (36.6 C)  SpO2: 100% 100%   Vitals:   01/03/21 0321 01/03/21 0801 01/03/21 1210 01/03/21 1535  BP: (!) 119/59 122/69 132/83 125/78  Pulse: (!) 50 (!) 43 (!) 45 (!) 52  Resp: 16 16 16 16   Temp: 97.7 F (36.5 C) 97.7 F (36.5 C) 97.8 F (36.6 C) 97.8 F (36.6 C)  TempSrc: Oral Oral Oral Oral  SpO2: 100% 100% 100% 100%  Weight:      Height:        General exam: Appears calm and comfortable.  The results of significant diagnostics from this hospitalization (including imaging, microbiology, ancillary and laboratory) are listed below for reference.     Procedures and Diagnostic Studies:    MR CERVICAL SPINE W WO CONTRAST  Result Date: 12/31/2020 CLINICAL DATA:  Baseline scanning for multiple sclerosis. Provided history of intracranial hypotension but not noted in the chart. EXAM: MRI CERVICAL AND THORACIC SPINE WITHOUT AND WITH CONTRAST TECHNIQUE: Multiplanar and multiecho pulse sequences of the cervical spine, to include the craniocervical junction and cervicothoracic junction, and the thoracic spine, were obtained without and with intravenous contrast. CONTRAST:  22mL GADAVIST GADOBUTROL 1 MMOL/ML IV SOLN COMPARISON:  None. FINDINGS: MRI CERVICAL SPINE FINDINGS Alignment: Physiologic Vertebrae: No fracture, evidence of discitis, or bone lesion. Cord: Short segment cord signal abnormalities which are nonenhancing but have a somewhat rounded/slightly swollen appearance: 1. Central to posterior cord at C2 2. Central the posterior cord at C3 3. Posterior cord at C4 4. Central to right cord at C5 Posterior Fossa, vertebral arteries, paraspinal tissues: Negative Disc levels: No degenerative changes or impingement. MRI THORACIC SPINE FINDINGS Alignment:  Physiologic. Vertebrae: No fracture, evidence of discitis, or bone lesion. Cord: None convincing small ventral T2 hyperintensity at the level of T9-10 on sagittal STIR imaging. No abnormal enhancement, cord swelling, or cord thinning. Paraspinal and other soft tissues: Negative Disc levels: No herniation or impingement. IMPRESSION: 1. Multiple foci of nonenhancing cord demyelination in the upper cervical region. 2. No definite thoracic cord involvement. Electronically Signed   By: Marnee Spring M.D.   On: 12/31/2020 05:11   MR THORACIC SPINE W WO CONTRAST  Result  Date: 12/31/2020 CLINICAL DATA:  Baseline scanning for multiple sclerosis. Provided history of intracranial hypotension but not noted in the chart. EXAM: MRI CERVICAL AND THORACIC SPINE WITHOUT AND WITH CONTRAST TECHNIQUE: Multiplanar and multiecho pulse sequences of the cervical  spine, to include the craniocervical junction and cervicothoracic junction, and the thoracic spine, were obtained without and with intravenous contrast. CONTRAST:  34mL GADAVIST GADOBUTROL 1 MMOL/ML IV SOLN COMPARISON:  None. FINDINGS: MRI CERVICAL SPINE FINDINGS Alignment: Physiologic Vertebrae: No fracture, evidence of discitis, or bone lesion. Cord: Short segment cord signal abnormalities which are nonenhancing but have a somewhat rounded/slightly swollen appearance: 1. Central to posterior cord at C2 2. Central the posterior cord at C3 3. Posterior cord at C4 4. Central to right cord at C5 Posterior Fossa, vertebral arteries, paraspinal tissues: Negative Disc levels: No degenerative changes or impingement. MRI THORACIC SPINE FINDINGS Alignment:  Physiologic. Vertebrae: No fracture, evidence of discitis, or bone lesion. Cord: None convincing small ventral T2 hyperintensity at the level of T9-10 on sagittal STIR imaging. No abnormal enhancement, cord swelling, or cord thinning. Paraspinal and other soft tissues: Negative Disc levels: No herniation or impingement. IMPRESSION: 1. Multiple foci of nonenhancing cord demyelination in the upper cervical region. 2. No definite thoracic cord involvement. Electronically Signed   By: Marnee Spring M.D.   On: 12/31/2020 05:11     Labs:   Basic Metabolic Panel: Recent Labs  Lab 12/29/20 1726 12/31/20 0232  NA 137 135  K 3.7 4.1  CL 105 105  CO2 25 22  GLUCOSE 115* 136*  BUN 16 10  CREATININE 0.96 0.87  CALCIUM 9.2 9.2   GFR Estimated Creatinine Clearance: 124.3 mL/min (by C-G formula based on SCr of 0.87 mg/dL). Liver Function Tests: No results for input(s): AST, ALT, ALKPHOS, BILITOT, PROT, ALBUMIN in the last 168 hours. No results for input(s): LIPASE, AMYLASE in the last 168 hours. No results for input(s): AMMONIA in the last 168 hours. Coagulation profile No results for input(s): INR, PROTIME in the last 168 hours.  CBC: Recent Labs  Lab  12/30/20 2017 12/31/20 0232 01/02/21 0039  WBC 13.6* 14.3* 23.3*  NEUTROABS 8.5*  --   --   HGB 13.6 13.2 12.8  HCT 41.8 41.3 38.4  MCV 85.8 85.3 82.8  PLT 325 330 243   Cardiac Enzymes: No results for input(s): CKTOTAL, CKMB, CKMBINDEX, TROPONINI in the last 168 hours. BNP: Invalid input(s): POCBNP CBG: Recent Labs  Lab 01/02/21 1131 01/02/21 1629 01/02/21 2116 01/03/21 0609 01/03/21 1213  GLUCAP 220* 222* 183* 199* 199*   D-Dimer No results for input(s): DDIMER in the last 72 hours. Hgb A1c No results for input(s): HGBA1C in the last 72 hours. Lipid Profile No results for input(s): CHOL, HDL, LDLCALC, TRIG, CHOLHDL, LDLDIRECT in the last 72 hours. Thyroid function studies No results for input(s): TSH, T4TOTAL, T3FREE, THYROIDAB in the last 72 hours.  Invalid input(s): FREET3 Anemia work up No results for input(s): VITAMINB12, FOLATE, FERRITIN, TIBC, IRON, RETICCTPCT in the last 72 hours. Microbiology Recent Results (from the past 240 hour(s))  Resp Panel by RT-PCR (Flu A&B, Covid) Nasopharyngeal Swab     Status: None   Collection Time: 12/30/20  9:01 PM   Specimen: Nasopharyngeal Swab; Nasopharyngeal(NP) swabs in vial transport medium  Result Value Ref Range Status   SARS Coronavirus 2 by RT PCR NEGATIVE NEGATIVE Final    Comment: (NOTE) SARS-CoV-2 target nucleic acids are NOT DETECTED.  The SARS-CoV-2 RNA is generally detectable in upper  respiratory specimens during the acute phase of infection. The lowest concentration of SARS-CoV-2 viral copies this assay can detect is 138 copies/mL. A negative result does not preclude SARS-Cov-2 infection and should not be used as the sole basis for treatment or other patient management decisions. A negative result may occur with  improper specimen collection/handling, submission of specimen other than nasopharyngeal swab, presence of viral mutation(s) within the areas targeted by this assay, and inadequate number of  viral copies(<138 copies/mL). A negative result must be combined with clinical observations, patient history, and epidemiological information. The expected result is Negative.  Fact Sheet for Patients:  BloggerCourse.com  Fact Sheet for Healthcare Providers:  SeriousBroker.it  This test is no t yet approved or cleared by the [place] FDA and  has been authorized for detection and/or diagnosis of SARS-CoV-2 by FDA under an Emergency Use Authorization (EUA). This EUA will remain  in effect (meaning this test can be used) for the duration of the COVID-19 declaration under Section 564(b)(1) of the Act, 21 U.S.C.section 360bbb-3(b)(1), unless the authorization is terminated  or revoked sooner.       Influenza A by PCR NEGATIVE NEGATIVE Final   Influenza B by PCR NEGATIVE NEGATIVE Final    Comment: (NOTE) The Xpert Xpress SARS-CoV-2/FLU/RSV plus assay is intended as an aid in the diagnosis of influenza from Nasopharyngeal swab specimens and should not be used as a sole basis for treatment. Nasal washings and aspirates are unacceptable for Xpert Xpress SARS-CoV-2/FLU/RSV testing.  Fact Sheet for Patients: BloggerCourse.com  Fact Sheet for Healthcare Providers: SeriousBroker.it  This test is not yet approved or cleared by the [place] FDA and has been authorized for detection and/or diagnosis of SARS-CoV-2 by FDA under an Emergency Use Authorization (EUA). This EUA will remain in effect (meaning this test can be used) for the duration of the COVID-19 declaration under Section 564(b)(1) of the Act, 21 U.S.C. section 360bbb-3(b)(1), unless the authorization is terminated or revoked.  Performed at [facility], [address], [place], [place] [address]      Discharge Instructions:   Discharge Instructions     Ambulatory referral to Neurology   Complete by: As  directed    An appointment is requested in approximately: 4 weeks   Diet general   Complete by: As directed    Increase activity slowly   Complete by: As directed       Allergies as of 01/03/2021   No Known Allergies      Medication List     STOP taking these medications    erythromycin ophthalmic ointment       TAKE these medications    calcium-vitamin D 500-200 MG-UNIT tablet Commonly known as: OSCAL WITH D Take 1 tablet by mouth daily with breakfast.   etonogestrel 68 MG Impl implant Commonly known as: NEXPLANON 68 mg by Subdermal route once.        Follow-up Information     [facility] Follow up on 02/13/2021.   Why: Your appointment is at 1:15 pm. Please arrive early and bring a picture ID and your current medications. Contact information: [place] [address] [phone]        [facility] Follow up.   Why: Please use this location for your pharmacy needs. Contact information: [address] [place] [address] [phone]  Time coordinating discharge: 35 min  Signed:  Harbeck Art DO  [facility] 01/04/2021, 5:20 PM

## 2021-01-09 ENCOUNTER — Ambulatory Visit: Payer: Self-pay | Admitting: Neurology

## 2021-02-13 ENCOUNTER — Other Ambulatory Visit: Payer: Self-pay

## 2021-02-13 ENCOUNTER — Encounter (INDEPENDENT_AMBULATORY_CARE_PROVIDER_SITE_OTHER): Payer: Self-pay | Admitting: Primary Care

## 2021-02-13 ENCOUNTER — Ambulatory Visit (INDEPENDENT_AMBULATORY_CARE_PROVIDER_SITE_OTHER): Payer: Self-pay | Admitting: Primary Care

## 2021-02-13 VITALS — BP 127/76 | HR 85 | Temp 97.5°F | Ht 67.0 in | Wt 257.0 lb

## 2021-02-13 DIAGNOSIS — F411 Generalized anxiety disorder: Secondary | ICD-10-CM

## 2021-02-13 DIAGNOSIS — M542 Cervicalgia: Secondary | ICD-10-CM

## 2021-02-13 DIAGNOSIS — F17210 Nicotine dependence, cigarettes, uncomplicated: Secondary | ICD-10-CM

## 2021-02-13 DIAGNOSIS — Z6841 Body Mass Index (BMI) 40.0 and over, adult: Secondary | ICD-10-CM

## 2021-02-13 DIAGNOSIS — Z09 Encounter for follow-up examination after completed treatment for conditions other than malignant neoplasm: Secondary | ICD-10-CM

## 2021-02-13 DIAGNOSIS — Z720 Tobacco use: Secondary | ICD-10-CM

## 2021-02-13 DIAGNOSIS — Z7689 Persons encountering health services in other specified circumstances: Secondary | ICD-10-CM

## 2021-02-13 DIAGNOSIS — R5383 Other fatigue: Secondary | ICD-10-CM

## 2021-02-13 NOTE — Progress Notes (Signed)
[facility]   Subjective:   Sydney Contreras is a 30 y.o. female presents for hospital follow up and establish care. Present to the Emergency room on 12/30/20, complaints of persistent left-sided blurred vision.Problem presented 5 days earlier. Discharged from the hospital on 01/03/21, patient was admitted for:  Optic neuritis. Only concerns that she is fatigue and the back of her neck hurts 6/10 she does not take anything for the pain. Pain is consistent rubbing her neck(stiff) provides some relief nothing to identify what makes it worst.   Past Medical History:  Diagnosis Date   Medical history non-contributory     No Known Allergies  Current Outpatient Medications on File Prior to Visit  Medication Sig Dispense Refill   calcium-vitamin D (OSCAL WITH D) 500-200 MG-UNIT tablet Take 1 tablet by mouth daily with breakfast. 30 tablet 0   etonogestrel (NEXPLANON) 68 MG IMPL implant 68 mg by Subdermal route once.     No current facility-administered medications on file prior to visit.     Review of System: Negative except those mention in HPI  Objective:  BP 127/76 (BP Location: Right Arm, Patient Position: Sitting, Cuff Size: Large)   Pulse 85   Temp (!) 97.5 F (36.4 C) (Temporal)   Ht 5\' 7"  (1.702 m)   Wt 257 lb (116.6 kg)   SpO2 95%   BMI 40.25 kg/m   Filed Weights   02/13/21 1406  Weight: 257 lb (116.6 kg)    Physical Exam: General Appearance: Well nourished, morbid obese female in no apparent distress. Eyes: PERRLA, EOMs, conjunctiva no swelling or erythema Sinuses: No Frontal/maxillary tenderness ENT/Mouth: Ext aud canals clear, TMs without erythema, bulging. 04/15/21 Hearing normal.  Neck: Supple, thyroid normal.  Respiratory: Respiratory effort normal, BS equal bilaterally without rales, rhonchi, wheezing or stridor.  Cardio: RRR with no MRGs. Brisk peripheral pulses without edema.  Abdomen: Soft, + BS.  Non tender, no guarding, rebound, hernias,  masses. Lymphatics: Non tender without lymphadenopathy.  Musculoskeletal: Full ROM, 5/5 strength, normal gait.  Skin: Warm, dry without rashes, lesions, ecchymosis.  Neuro: Cranial nerves intact. Normal muscle tone, no cerebellar symptoms. Sensation intact.  Psych: Awake and oriented X 3, normal affect, Insight and Judgment appropriate.    Assessment:  Sydney Contreras was seen today for hospitalization follow-up.  Diagnoses and all orders for this visit:  Encounter to establish care Establish care with PCP  Morbid obesity (HCC) Morbid Obesity is BMI > 40  indicating an excess in caloric intake or underlining conditions. This may lead to other co-morbidities. Lifestyle modifications of diet and exercise may reduce obesity.    Hospital discharge follow-up Recommended to follow up with Dr. Marland Kitchen neurologist new dx Optic neuritis and  Multiple sclerosis $200 co-pay unable to afford advised to get financial assistance at d/c Follow up with [facility] on 02/13/2021- completed   Fatigue, unspecified type Does not sleep underlying cause of fatigue. CBC reviewed wnl   Insomnia Onset: 7 years  Pattern:    Difficulty going to sleep:yes     Frequent awakening:yes     Early awakening: yes  Nightmares:no Abnormal leg movement: no  Snoring:no Apnea: Risk factors/sleep hygiene:    Stimulants:no     Alcohol intake: no    Reading, watching TV, eating @ bedtime:    Daytime naps: yes     Stress/anxiety: yes     Work/travel factors:no  Impact:    Daytime hypersomnolence: yes     Motor vehicle accident/motor  dysfunction:does not drive  Evaluation to date: 02/13/21 Treatment to date/efficacy:   Benadryl or melatonin placed on AVS  Neck pain Probably related to stress - my use OTC ibuprofen    Tobacco abuse - I have recommended complete cessation of tobacco use. I have discussed various options available for assistance with tobacco cessation including over the  counter methods (Nicotine gum, patch and lozenges). We also discussed prescription options (Chantix, Nicotine Inhaler / Nasal Spray). The patient is not interested in pursuing any prescription tobacco cessation options at this time.   Generalized anxiety disorder In school son just dx with autism single parent  Refer to CSW This note has been created with Education officer, environmental. Any transcriptional errors are unintentional.   Grayce Sessions, NP 02/13/2021, 2:12 PM

## 2021-02-13 NOTE — Patient Instructions (Signed)
Can try melatonin 5mg-15 mg at night for sleep, can also do benadryl 25-50mg at night for sleep.  If this does not help we can try prescription medication.  Also here is some information about good sleep hygiene.   Insomnia Insomnia is frequent trouble falling and/or staying asleep. Insomnia can be a long term problem or a short term problem. Both are common. Insomnia can be a short term problem when the wakefulness is related to a certain stress or worry. Long term insomnia is often related to ongoing stress during waking hours and/or poor sleeping habits. Overtime, sleep deprivation itself can make the problem worse. Every little thing feels more severe because you are overtired and your ability to cope is decreased. CAUSES  Stress, anxiety, and depression. Poor sleeping habits. Distractions such as TV in the bedroom. Naps close to bedtime. Engaging in emotionally charged conversations before bed. Technical reading before sleep. Alcohol and other sedatives. They may make the problem worse. They can hurt normal sleep patterns and normal dream activity. Stimulants such as caffeine for several hours prior to bedtime. Pain syndromes and shortness of breath can cause insomnia. Exercise late at night. Changing time zones may cause sleeping problems (jet lag). It is sometimes helpful to have someone observe your sleeping patterns. They should look for periods of not breathing during the night (sleep apnea). They should also look to see how long those periods last. If you live alone or observers are uncertain, you can also be observed at a sleep clinic where your sleep patterns will be professionally monitored. Sleep apnea requires a checkup and treatment. Give your caregivers your medical history. Give your caregivers observations your family has made about your sleep.  SYMPTOMS  Not feeling rested in the morning. Anxiety and restlessness at bedtime. Difficulty falling and staying asleep. TREATMENT   Your caregiver may prescribe treatment for an underlying medical disorders. Your caregiver can give advice or help if you are using alcohol or other drugs for self-medication. Treatment of underlying problems will usually eliminate insomnia problems. Medications can be prescribed for short time use. They are generally not recommended for lengthy use. Over-the-counter sleep medicines are not recommended for lengthy use. They can be habit forming. You can promote easier sleeping by making lifestyle changes such as: Using relaxation techniques that help with breathing and reduce muscle tension. Exercising earlier in the day. Changing your diet and the time of your last meal. No night time snacks. Establish a regular time to go to bed. Counseling can help with stressful problems and worry. Soothing music and white noise may be helpful if there are background noises you cannot remove. Stop tedious detailed work at least one hour before bedtime. HOME CARE INSTRUCTIONS  Keep a diary. Inform your caregiver about your progress. This includes any medication side effects. See your caregiver regularly. Take note of: Times when you are asleep. Times when you are awake during the night. The quality of your sleep. How you feel the next day. This information will help your caregiver care for you. Get out of bed if you are still awake after 15 minutes. Read or do some quiet activity. Keep the lights down. Wait until you feel sleepy and go back to bed. Keep regular sleeping and waking hours. Avoid naps. Exercise regularly. Avoid distractions at bedtime. Distractions include watching television or engaging in any intense or detailed activity like attempting to balance the household checkbook. Develop a bedtime ritual. Keep a familiar routine of bathing, brushing your teeth,   climbing into bed at the same time each night, listening to soothing music. Routines increase the success of falling to sleep faster. Use  relaxation techniques. This can be using breathing and muscle tension release routines. It can also include visualizing peaceful scenes. You can also help control troubling or intruding thoughts by keeping your mind occupied with boring or repetitive thoughts like the old concept of counting sheep. You can make it more creative like imagining planting one beautiful flower after another in your backyard garden. During your day, work to eliminate stress. When this is not possible use some of the previous suggestions to help reduce the anxiety that accompanies stressful situations. MAKE SURE YOU:  Understand these instructions. Will watch your condition. Will get help right away if you are not doing well or get worse. Document Released: 05/24/2000 Document Revised: 08/19/2011 Document Reviewed: 06/24/2007 ExitCare Patient Information 2015 [employer]. This information is not intended to replace advice given to you by your health care provider. Make sure you discuss any questions you have with your health care provider.  

## 2021-02-16 ENCOUNTER — Telehealth: Payer: Self-pay | Admitting: Clinical

## 2021-02-23 NOTE — Telephone Encounter (Signed)
Spoke with pt and scheduled appt for 03/22/21 at 10:00

## 2021-03-22 ENCOUNTER — Encounter (INDEPENDENT_AMBULATORY_CARE_PROVIDER_SITE_OTHER): Payer: Medicaid Other | Admitting: Clinical
